# Patient Record
Sex: Female | Born: 1990 | Hispanic: Yes | Marital: Married | Smoking: Never smoker
Health system: Southern US, Community
[De-identification: ages and names within clinical notes are randomized; demographics above are authoritative.]

## PROBLEM LIST (undated history)

## (undated) DIAGNOSIS — K2900 Acute gastritis without bleeding: Secondary | ICD-10-CM

## (undated) DIAGNOSIS — Z8759 Personal history of other complications of pregnancy, childbirth and the puerperium: Secondary | ICD-10-CM

## (undated) DIAGNOSIS — Z227 Latent tuberculosis: Secondary | ICD-10-CM

## (undated) DIAGNOSIS — R222 Localized swelling, mass and lump, trunk: Secondary | ICD-10-CM

## (undated) DIAGNOSIS — Z789 Other specified health status: Secondary | ICD-10-CM

## (undated) HISTORY — DX: Acute gastritis without bleeding: K29.00

## (undated) HISTORY — DX: Localized swelling, mass and lump, trunk: R22.2

## (undated) HISTORY — DX: Personal history of other complications of pregnancy, childbirth and the puerperium: Z87.59

## (undated) HISTORY — DX: Latent tuberculosis: Z22.7

---
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
## 2008-04-11 ENCOUNTER — Observation Stay: Payer: Self-pay | Admitting: Certified Nurse Midwife

## 2008-04-19 ENCOUNTER — Inpatient Hospital Stay: Payer: Self-pay

## 2009-02-20 ENCOUNTER — Ambulatory Visit: Payer: Self-pay | Admitting: Family Medicine

## 2015-10-04 NOTE — L&D Delivery Note (Signed)
Delivery Note  EGA: 39.1 EDC: 07/02/2016,  LMP: 09/26/2015.  At 9:54 AM a viable female was delivered via VBAC, Spontaneous (Presentation: cephalic; ROA).  APGAR: 7,8 ; weight  6-10, .   Placenta status: spontnaneous, intact .  Cord: 3 vessels,  with the following complications: none .  Cord pH: n/a  Anesthesia:  epidural Episiotomy: None Lacerations:  none Suture Repair: n/a Est. Blood Loss (mL):  <50cc  Mom to postpartum.  Baby to Couplet care / Skin to Skin.  Mom presented in labor and requested TOLAC.  Prior cesarean was for fetal bradycardia.  She was given an epidural at advanced dilation. AROM'd for clear fluid.  2nd stage was ~1hrs after reduction of anterior lip.  During 2nd stage there were periods of bradycardia to 90s and tachycardia to 200, but maternal effort was strong and fetal movement was good so we continued.  Vacuum was considered but never used.  Once baby was delivered she was placed on mom's chest and attended to by pediatric staff.  After cord became pulseless (>60sec)  It was doubly clamped and cut by FOB.  [facility].    A Spansh interpreter was present throughout.   Rayvion Stumph C Cheril Slattery 06/26/2016, 10:02 AM

## 2015-11-26 ENCOUNTER — Other Ambulatory Visit: Payer: Self-pay | Admitting: Physician Assistant

## 2015-11-26 DIAGNOSIS — Z369 Encounter for antenatal screening, unspecified: Secondary | ICD-10-CM

## 2015-11-27 ENCOUNTER — Emergency Department
Admission: EM | Admit: 2015-11-27 | Discharge: 2015-11-27 | Disposition: A | Discharge status: Home / Self Care | Payer: PRIVATE HEALTH INSURANCE | Attending: Emergency Medicine | Admitting: Emergency Medicine

## 2015-11-27 ENCOUNTER — Encounter: Payer: Self-pay | Admitting: Emergency Medicine

## 2015-11-27 DIAGNOSIS — O26899 Other specified pregnancy related conditions, unspecified trimester: Secondary | ICD-10-CM

## 2015-11-27 DIAGNOSIS — O9989 Other specified diseases and conditions complicating pregnancy, childbirth and the puerperium: Secondary | ICD-10-CM | POA: Diagnosis not present

## 2015-11-27 DIAGNOSIS — R509 Fever, unspecified: Secondary | ICD-10-CM | POA: Diagnosis not present

## 2015-11-27 DIAGNOSIS — Z3A08 8 weeks gestation of pregnancy: Secondary | ICD-10-CM | POA: Diagnosis not present

## 2015-11-27 DIAGNOSIS — R109 Unspecified abdominal pain: Secondary | ICD-10-CM | POA: Diagnosis not present

## 2015-11-27 LAB — URINALYSIS COMPLETE WITH MICROSCOPIC (ARMC ONLY)
Bilirubin Urine: NEGATIVE
Glucose, UA: NEGATIVE mg/dL
Hgb urine dipstick: NEGATIVE
Ketones, ur: NEGATIVE mg/dL
Leukocytes, UA: NEGATIVE
Nitrite: NEGATIVE
Protein, ur: NEGATIVE mg/dL
Specific Gravity, Urine: 1.024 (ref 1.005–1.030)
pH: 6 (ref 5.0–8.0)

## 2015-11-27 LAB — CBC
HCT: 37.3 % (ref 35.0–47.0)
Hemoglobin: 12.9 g/dL (ref 12.0–16.0)
MCH: 31.8 pg (ref 26.0–34.0)
MCHC: 34.5 g/dL (ref 32.0–36.0)
MCV: 91.9 fL (ref 80.0–100.0)
Platelets: 256 10*3/uL (ref 150–440)
RBC: 4.06 MIL/uL (ref 3.80–5.20)
RDW: 12.5 % (ref 11.5–14.5)
WBC: 11.7 10*3/uL — ABNORMAL HIGH (ref 3.6–11.0)

## 2015-11-27 LAB — COMPREHENSIVE METABOLIC PANEL
ALT: 10 U/L — ABNORMAL LOW (ref 14–54)
AST: 19 U/L (ref 15–41)
Albumin: 3.8 g/dL (ref 3.5–5.0)
Alkaline Phosphatase: 62 U/L (ref 38–126)
Anion gap: 6 (ref 5–15)
BUN: 10 mg/dL (ref 6–20)
CO2: 23 mmol/L (ref 22–32)
Calcium: 9 mg/dL (ref 8.9–10.3)
Chloride: 103 mmol/L (ref 101–111)
Creatinine, Ser: 0.59 mg/dL (ref 0.44–1.00)
GFR calc Af Amer: >60 mL/min (ref >60)
GFR calc non Af Amer: >60 mL/min (ref >60)
Glucose, Bld: 126 mg/dL — ABNORMAL HIGH (ref 65–99)
Potassium: 3.3 mmol/L — ABNORMAL LOW (ref 3.5–5.1)
Sodium: 132 mmol/L — ABNORMAL LOW (ref 135–145)
Total Bilirubin: 0.4 mg/dL (ref 0.3–1.2)
Total Protein: 7.2 g/dL (ref 6.5–8.1)

## 2015-11-27 LAB — HCG, QUANTITATIVE, PREGNANCY: hCG, Beta Chain, Quant, S: 171040 m[IU]/mL — ABNORMAL HIGH (ref <5)

## 2015-11-27 LAB — LIPASE, BLOOD: Lipase: 38 U/L (ref 11–51)

## 2015-11-27 LAB — OB RESULTS CONSOLE HEPATITIS B SURFACE ANTIGEN: Hepatitis B Surface Ag: NEGATIVE

## 2015-11-27 MED ORDER — DIPHENHYDRAMINE HCL 25 MG PO CAPS: 25.0000 mg | ORAL_CAPSULE | Freq: Three times a day (TID) | ORAL | Status: DC | PRN

## 2015-11-27 MED ORDER — METOCLOPRAMIDE HCL 10 MG PO TABS: 10.0000 mg | ORAL_TABLET | Freq: Three times a day (TID) | ORAL | Status: DC | PRN

## 2015-11-27 NOTE — ED Notes (Signed)
Fetal heart tones accessed by ultrasound. FHR 176 bpm

## 2015-11-27 NOTE — ED Notes (Addendum)
Patient states that she has had abd pain and fever that started this morning. Patient denies any sick contacts. Patient is able to eat and drink. Pain is not worse after eating or drinking. Patient has been nauseated but states that this is not new, patient is currently pregnant.

## 2015-11-27 NOTE — Discharge Instructions (Signed)
Dolor abdominal en adultos °(Abdominal Pain, Adult) °El dolor puede tener muchas causas. Normalmente la causa del dolor abdominal no es una enfermedad y mejorará sin tratamiento. Frecuentemente puede controlarse y tratarse en casa. Su médico le realizará un examen físico y posiblemente solicite análisis de sangre y radiografías para ayudar a determinar la gravedad de su dolor. Sin embargo, en muchos casos, debe transcurrir más tiempo antes de que se pueda encontrar una causa evidente del dolor. Antes de llegar a ese punto, es posible que su médico no sepa si necesita más pruebas o un tratamiento más profundo. °INSTRUCCIONES PARA EL CUIDADO EN EL HOGAR  °Esté atento al dolor para ver si hay cambios. Las siguientes indicaciones ayudarán a aliviar cualquier molestia que pueda sentir: °· Tome solo medicamentos de venta libre o recetados, según las indicaciones del médico. °· No tome laxantes a menos que se lo haya indicado su médico. °· Pruebe con una dieta líquida absoluta (caldo, té o agua) según se lo indique su médico. Introduzca gradualmente una dieta normal, según su tolerancia. °SOLICITE ATENCIÓN MÉDICA SI: °· Tiene dolor abdominal sin explicación. °· Tiene dolor abdominal relacionado con náuseas o diarrea. °· Tiene dolor cuando orina o defeca. °· Experimenta dolor abdominal que lo despierta de noche. °· Tiene dolor abdominal que empeora o mejora cuando come alimentos. °· Tiene dolor abdominal que empeora cuando come alimentos grasosos. °· Tiene fiebre. °SOLICITE ATENCIÓN MÉDICA DE INMEDIATO SI:  °· El dolor no desaparece en un plazo máximo de 2 horas. °· No deja de (vomitar). °· El dolor se siente solo en partes del abdomen, como el lado derecho o la parte inferior izquierda del abdomen. °· Evacúa materia fecal sanguinolenta o negra, de aspecto alquitranado. °ASEGÚRESE DE QUE: °· Comprende estas instrucciones. °· Controlará su afección. °· Recibirá ayuda de inmediato si no mejora o si empeora. °  °Esta  información no tiene como fin reemplazar el consejo del médico. Asegúrese de hacerle al médico cualquier pregunta que tenga. °  °Document Released: 09/19/2005 Document Revised: 10/10/2014 °Elsevier Interactive Patient Education ©2016 Elsevier Inc. ° °

## 2015-11-27 NOTE — ED Notes (Signed)
Patient states that she is two months pregnant and started having lower abdominal pain yesterday.  Patient was seen at health department yesterday morning, but started having symptoms yesterday afternoon.  Patient was prescribed phenergan at the health department, but states the pills are not helping.  LMP:  09/26/2015 EDC:  07/02/2016 P: 3  G:1  A: 1

## 2015-11-27 NOTE — ED Provider Notes (Signed)
[facility] Emergency Department Provider Note  ____________________________________________  Time seen: 6:10 PM  I have reviewed the triage vital signs and the nursing notes.   HISTORY  Chief Complaint Abdominal Pain  Interviewed with Spanish interpreter  HPI Dana Stephens is a 25 y.o. female who complains of suprapubic abdominal pain and subjective fever that started this morning. Yesterday she was in her usual state of health and completely asymptomatic. She has had some morning sickness with nausea over the past several weeks as she is [redacted] weeks pregnant and following up with the health department, but is eating and drinking without difficulty. No aggravating or alleviating factors. No vomiting or diarrhea, no chest pain shortness of breath or other symptoms. No urinary symptoms, no vaginal bleeding or discharge.  G3 P1. History of one abortion. LMP 09/26/2015.     History reviewed. No pertinent past medical history.   There are no active problems to display for this patient.    History reviewed. No pertinent past surgical history.   Current Outpatient Rx  Name  Route  Sig  Dispense  Refill  . diphenhydrAMINE (BENADRYL) 25 mg capsule   Oral   Take 1 capsule (25 mg total) by mouth every 8 (eight) hours as needed (nausea).   20 capsule   0   . metoCLOPramide (REGLAN) 10 MG tablet   Oral   Take 1 tablet (10 mg total) by mouth every 8 (eight) hours as needed for nausea.   60 tablet   0      Allergies Review of patient's allergies indicates no known allergies.   No family history on file.  Social History Social History  Substance Use Topics  . Smoking status: Never Smoker   . Smokeless tobacco: Never Used  . Alcohol Use: No    Review of Systems  Constitutional:   Subjective fever. No weight changes Eyes:   No blurry vision or double vision.  ENT:   No sore throat.  Cardiovascular:   No chest pain. Respiratory:   No  dyspnea or cough. Gastrointestinal:   Positive for abdominal pain, without vomiting and diarrhea.  No BRBPR or melena. Genitourinary:   Negative for dysuria or difficulty urinating. Musculoskeletal:   Negative for back pain. No joint swelling or pain. Skin:   Negative for rash. Neurological:   Negative for headaches, focal weakness or numbness. Psychiatric:  No anxiety or depression.   Endocrine:  No changes in energy or sleep difficulty.  10-point ROS otherwise negative.  ____________________________________________   PHYSICAL EXAM:  VITAL SIGNS: ED Triage Vitals  Enc Vitals Group     BP 11/27/15 1622 113/62 mmHg     Pulse Rate 11/27/15 1622 92     Resp 11/27/15 1622 16     Temp 11/27/15 1622 98.8 F (37.1 C)     Temp Source 11/27/15 1622 Oral     SpO2 11/27/15 1622 100 %     Weight 11/27/15 1622 118 lb (53.524 kg)     Height 11/27/15 1622  (1.702 m)     Head Cir --      Peak Flow --      Pain Score 11/27/15 1623 8     Pain Loc --      Pain Edu? --      Excl. in GC? --     Vital signs reviewed, nursing assessments reviewed.   Constitutional:   Alert and oriented. Well appearing and in no distress. Eyes:   No  scleral icterus. No conjunctival pallor. PERRL. EOMI ENT   Head:   Normocephalic and atraumatic.   Nose:   No congestion/rhinnorhea. No septal hematoma   Mouth/Throat:   MMM, no pharyngeal erythema. No peritonsillar mass.    Neck:   No stridor. No SubQ emphysema. No meningismus. Hematological/Lymphatic/Immunilogical:   No cervical lymphadenopathy. Cardiovascular:   RRR. Symmetric bilateral radial and DP pulses.  No murmurs.  Respiratory:   Normal respiratory effort without tachypnea nor retractions. Breath sounds are clear and equal bilaterally. No wheezes/rales/rhonchi. Gastrointestinal:   Soft and nontender. Non distended. There is no CVA tenderness.  No rebound, rigidity, or guarding. Genitourinary:   deferred Musculoskeletal:   Nontender  with normal range of motion in all extremities. No joint effusions.  No lower extremity tenderness.  No edema. Neurologic:   Normal speech and language.  CN 2-10 normal. Motor grossly intact. No gross focal neurologic deficits are appreciated.  Skin:    Skin is warm, dry and intact. No rash noted.  No petechiae, purpura, or bullae. Psychiatric:   Mood and affect are normal. ____________________________________________    LABS (pertinent positives/negatives) (all labs ordered are listed, but only abnormal results are displayed) Labs Reviewed  COMPREHENSIVE METABOLIC PANEL - Abnormal; Notable for the following:    Sodium 132 (*)    Potassium 3.3 (*)    Glucose, Bld 126 (*)    ALT 10 (*)    All other components within normal limits  CBC - Abnormal; Notable for the following:    WBC 11.7 (*)    All other components within normal limits  URINALYSIS COMPLETEWITH MICROSCOPIC ([facility] ONLY) - Abnormal; Notable for the following:    Color, Urine YELLOW (*)    APPearance HAZY (*)    Bacteria, UA RARE (*)    Squamous Epithelial / LPF 6-30 (*)    All other components within normal limits  HCG, QUANTITATIVE, PREGNANCY - Abnormal; Notable for the following:    hCG, Beta Chain, Quant, S 171040 (*)    All other components within normal limits  LIPASE, BLOOD   ____________________________________________   EKG    ____________________________________________    RADIOLOGY    ____________________________________________   PROCEDURES   ____________________________________________   INITIAL IMPRESSION / ASSESSMENT AND PLAN / ED COURSE  Pertinent labs & imaging results that were available during my care of the patient were reviewed by me and considered in my medical decision making (see chart for details).  Patient well appearing no acute distress. Exam is unremarkable and reassuring. Labs are also completely unremarkable. Low suspicion for appendicitis urinary tract infection or  pyelonephritis or bacterial vaginosis or STI. We'll discharge home to follow up with health department. I suspect this is typical pregnancy pain related to expanding tissues. She reports inadequate relief of her nausea with Phenergan so prescribe her Reglan and Benadryl as needed. These are both category B for pregnancy     ____________________________________________   FINAL CLINICAL IMPRESSION(S) / ED DIAGNOSES  Final diagnoses:  Abdominal pain in pregnancy      Sharman Cheek, MD 11/27/15 1836

## 2015-12-21 ENCOUNTER — Ambulatory Visit
Admission: RE | Admit: 2015-12-21 | Discharge: 2015-12-21 | Disposition: A | Discharge status: Home / Self Care | Payer: PRIVATE HEALTH INSURANCE | Source: Ambulatory Visit | Attending: Physician Assistant | Admitting: Physician Assistant

## 2015-12-21 ENCOUNTER — Ambulatory Visit (HOSPITAL_BASED_OUTPATIENT_CLINIC_OR_DEPARTMENT_OTHER)
Admission: RE | Admit: 2015-12-21 | Discharge: 2015-12-21 | Disposition: A | Discharge status: Home / Self Care | Payer: PRIVATE HEALTH INSURANCE | Source: Ambulatory Visit | Attending: Obstetrics and Gynecology | Admitting: Obstetrics and Gynecology

## 2015-12-21 VITALS — BP 116/63 | HR 96 | Temp 98.3°F | Resp 17 | Ht 61.0 in | Wt 119.0 lb

## 2015-12-21 DIAGNOSIS — Z363 Encounter for antenatal screening for malformations: Secondary | ICD-10-CM

## 2015-12-21 DIAGNOSIS — Z369 Encounter for antenatal screening, unspecified: Secondary | ICD-10-CM

## 2015-12-21 DIAGNOSIS — Z36 Encounter for antenatal screening of mother: Secondary | ICD-10-CM | POA: Diagnosis not present
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
## 2015-12-21 NOTE — Progress Notes (Signed)
Dana Wells, MS, CGC performed an integral service incident to the physician's initial service.  I was physically present in the clinical area and was immediately available to render assistance.   [name]  

## 2015-12-24 ENCOUNTER — Telehealth: Payer: Self-pay | Admitting: Obstetrics and Gynecology

## 2015-12-24 NOTE — Telephone Encounter (Signed)
  [name] elected to undergo First Trimester screening on 12/21/2015.  To review, first trimester screening, includes nuchal translucency ultrasound screen and/or first trimester maternal serum marker screening.  The nuchal translucency has approximately an 80% detection rate for Down syndrome and can be positive for other chromosome abnormalities as well as heart defects.  When combined with a maternal serum marker screening, the detection rate is up to 90% for Down syndrome and up to 97% for trisomy 13 and 18.     The results of the First Trimester Nuchal Translucency and Biochemical Screening were within normal range.  The risk for Down syndrome is now estimated to be less than 1 in 10,000.  The risk for Trisomy 13/18 is also estimated to be less than 1 in 10,000.  Should more definitive information be desired, we would offer amniocentesis.  Because we do not yet know the effectiveness of combined first and second trimester screening, we do not recommend a maternal serum screen to assess the chance for chromosome conditions.  However, if screening for neural tube defects is desired, maternal serum screening for AFP only can be performed between 15 and [redacted] weeks gestation.    Cherly Andersoneborah F. Hesham Womac, MS, CGC

## 2016-02-01 ENCOUNTER — Ambulatory Visit
Admission: RE | Admit: 2016-02-01 | Discharge: 2016-02-01 | Disposition: A | Discharge status: Home / Self Care | Payer: PRIVATE HEALTH INSURANCE | Source: Ambulatory Visit | Attending: Obstetrics & Gynecology | Admitting: Obstetrics & Gynecology

## 2016-02-01 DIAGNOSIS — Z36 Encounter for antenatal screening of mother: Secondary | ICD-10-CM | POA: Insufficient documentation

## 2016-02-01 DIAGNOSIS — Z363 Encounter for antenatal screening for malformations: Secondary | ICD-10-CM

## 2016-02-01 DIAGNOSIS — Z3A18 18 weeks gestation of pregnancy: Secondary | ICD-10-CM | POA: Diagnosis not present

## 2016-04-07 LAB — OB RESULTS CONSOLE HIV ANTIBODY (ROUTINE TESTING): HIV: NONREACTIVE

## 2016-04-08 LAB — OB RESULTS CONSOLE GC/CHLAMYDIA
Chlamydia: NEGATIVE
Gonorrhea: NEGATIVE

## 2016-04-08 LAB — OB RESULTS CONSOLE RPR: RPR: NONREACTIVE

## 2016-06-13 LAB — OB RESULTS CONSOLE GBS: GBS: NEGATIVE

## 2016-06-26 ENCOUNTER — Inpatient Hospital Stay
Admission: EM | Admit: 2016-06-26 | Discharge: 2016-06-27 | DRG: 775 | Disposition: A | Discharge status: Home / Self Care | Payer: Medicaid Other | Attending: Obstetrics and Gynecology | Admitting: Obstetrics and Gynecology

## 2016-06-26 ENCOUNTER — Inpatient Hospital Stay: Payer: Medicaid Other | Admitting: Anesthesiology

## 2016-06-26 DIAGNOSIS — Z7982 Long term (current) use of aspirin: Secondary | ICD-10-CM

## 2016-06-26 DIAGNOSIS — Z98891 History of uterine scar from previous surgery: Secondary | ICD-10-CM | POA: Diagnosis present

## 2016-06-26 DIAGNOSIS — R001 Bradycardia, unspecified: Secondary | ICD-10-CM | POA: Diagnosis present

## 2016-06-26 DIAGNOSIS — Z79899 Other long term (current) drug therapy: Secondary | ICD-10-CM

## 2016-06-26 DIAGNOSIS — Z3A39 39 weeks gestation of pregnancy: Secondary | ICD-10-CM | POA: Diagnosis not present

## 2016-06-26 DIAGNOSIS — O34211 Maternal care for low transverse scar from previous cesarean delivery: Secondary | ICD-10-CM | POA: Diagnosis present

## 2016-06-26 DIAGNOSIS — Z8759 Personal history of other complications of pregnancy, childbirth and the puerperium: Secondary | ICD-10-CM

## 2016-06-26 DIAGNOSIS — O0993 Supervision of high risk pregnancy, unspecified, third trimester: Secondary | ICD-10-CM

## 2016-06-26 DIAGNOSIS — O34219 Maternal care for unspecified type scar from previous cesarean delivery: Secondary | ICD-10-CM | POA: Diagnosis present

## 2016-06-26 DIAGNOSIS — Z227 Latent tuberculosis: Secondary | ICD-10-CM | POA: Diagnosis present

## 2016-06-26 DIAGNOSIS — R000 Tachycardia, unspecified: Secondary | ICD-10-CM | POA: Diagnosis present

## 2016-06-26 DIAGNOSIS — Z369 Encounter for antenatal screening, unspecified: Secondary | ICD-10-CM

## 2016-06-26 DIAGNOSIS — O09299 Supervision of pregnancy with other poor reproductive or obstetric history, unspecified trimester: Secondary | ICD-10-CM

## 2016-06-26 HISTORY — DX: Other specified health status: Z78.9

## 2016-06-26 LAB — COMPREHENSIVE METABOLIC PANEL
ALT: 18 U/L (ref 14–54)
AST: 32 U/L (ref 15–41)
Albumin: 3.4 g/dL — ABNORMAL LOW (ref 3.5–5.0)
Alkaline Phosphatase: 155 U/L — ABNORMAL HIGH (ref 38–126)
Anion gap: 5 (ref 5–15)
BUN: 15 mg/dL (ref 6–20)
CO2: 23 mmol/L (ref 22–32)
Calcium: 10.3 mg/dL (ref 8.9–10.3)
Chloride: 107 mmol/L (ref 101–111)
Creatinine, Ser: 0.66 mg/dL (ref 0.44–1.00)
GFR calc Af Amer: >60 mL/min (ref >60)
GFR calc non Af Amer: >60 mL/min (ref >60)
Glucose, Bld: 85 mg/dL (ref 65–99)
Potassium: 4 mmol/L (ref 3.5–5.1)
Sodium: 135 mmol/L (ref 135–145)
Total Bilirubin: 0.4 mg/dL (ref 0.3–1.2)
Total Protein: 7.1 g/dL (ref 6.5–8.1)

## 2016-06-26 LAB — CBC
HCT: 39.8 % (ref 35.0–47.0)
Hemoglobin: 13.7 g/dL (ref 12.0–16.0)
MCH: 31.4 pg (ref 26.0–34.0)
MCHC: 34.5 g/dL (ref 32.0–36.0)
MCV: 91 fL (ref 80.0–100.0)
Platelets: 191 10*3/uL (ref 150–440)
RBC: 4.37 MIL/uL (ref 3.80–5.20)
RDW: 13.6 % (ref 11.5–14.5)
WBC: 14.2 10*3/uL — ABNORMAL HIGH (ref 3.6–11.0)

## 2016-06-26 LAB — TYPE AND SCREEN
ABO/RH(D): O POS
Antibody Screen: NEGATIVE

## 2016-06-26 LAB — PROTEIN / CREATININE RATIO, URINE
Creatinine, Urine: 130 mg/dL
Protein Creatinine Ratio: 0.27 mg/mg{Cre} — ABNORMAL HIGH (ref 0.00–0.15)
Total Protein, Urine: 35 mg/dL

## 2016-06-26 LAB — URIC ACID: Uric Acid, Serum: 6.5 mg/dL (ref 2.3–6.6)

## 2016-06-26 MED ORDER — FENTANYL 2.5 MCG/ML W/ROPIVACAINE 0.2% IN NS 100 ML EPIDURAL INFUSION (ARMC-ANES): 10.0000 mL/h | EPIDURAL | Status: DC

## 2016-06-26 MED ORDER — AMMONIA AROMATIC IN INHA
RESPIRATORY_TRACT | Status: AC
  Filled 2016-06-26: qty 10

## 2016-06-26 MED ORDER — INFLUENZA VAC SPLIT QUAD 0.5 ML IM SUSY
0.5000 mL | PREFILLED_SYRINGE | INTRAMUSCULAR | Status: AC | PRN
  Administered 2016-06-27: 0.5 mL via INTRAMUSCULAR
  Filled 2016-06-26: qty 0.5

## 2016-06-26 MED ORDER — NALOXONE HCL 0.4 MG/ML IJ SOLN: 0.4000 mg | INTRAMUSCULAR | Status: DC | PRN

## 2016-06-26 MED ORDER — NALOXONE HCL 2 MG/2ML IJ SOSY: 1.0000 ug/kg/h | PREFILLED_SYRINGE | INTRAVENOUS | Status: DC | PRN

## 2016-06-26 MED ORDER — WITCH HAZEL-GLYCERIN EX PADS: 1.0000 "application " | MEDICATED_PAD | CUTANEOUS | Status: DC | PRN

## 2016-06-26 MED ORDER — PRENATAL MULTIVITAMIN CH
1.0000 | ORAL_TABLET | Freq: Every day | ORAL | Status: DC
  Administered 2016-06-26 – 2016-06-27 (×2): 1 via ORAL
  Filled 2016-06-26 (×2): qty 1

## 2016-06-26 MED ORDER — LIDOCAINE-EPINEPHRINE (PF) 1.5 %-1:200000 IJ SOLN
INTRAMUSCULAR | Status: DC | PRN
  Administered 2016-06-26: 3 mL via EPIDURAL

## 2016-06-26 MED ORDER — FENTANYL CITRATE (PF) 100 MCG/2ML IJ SOLN: 100.0000 ug | Freq: Once | INTRAMUSCULAR | Status: DC

## 2016-06-26 MED ORDER — MISOPROSTOL 200 MCG PO TABS
ORAL_TABLET | ORAL | Status: DC
Start: 2016-06-26 — End: 2016-06-26
  Filled 2016-06-26: qty 4

## 2016-06-26 MED ORDER — DIPHENHYDRAMINE HCL 50 MG/ML IJ SOLN: 12.5000 mg | INTRAMUSCULAR | Status: DC | PRN

## 2016-06-26 MED ORDER — COCONUT OIL OIL: 1.0000 "application " | TOPICAL_OIL | Status: DC | PRN

## 2016-06-26 MED ORDER — MEPERIDINE HCL 25 MG/ML IJ SOLN: 6.2500 mg | INTRAMUSCULAR | Status: DC | PRN

## 2016-06-26 MED ORDER — ONDANSETRON HCL 4 MG/2ML IJ SOLN: 4.0000 mg | INTRAMUSCULAR | Status: DC | PRN

## 2016-06-26 MED ORDER — LIDOCAINE HCL (PF) 1 % IJ SOLN
INTRAMUSCULAR | Status: AC
  Filled 2016-06-26: qty 30

## 2016-06-26 MED ORDER — OXYTOCIN 10 UNIT/ML IJ SOLN
INTRAMUSCULAR | Status: DC
Start: 2016-06-26 — End: 2016-06-26
  Filled 2016-06-26: qty 2

## 2016-06-26 MED ORDER — ACETAMINOPHEN 500 MG PO TABS: 1000.0000 mg | ORAL_TABLET | Freq: Four times a day (QID) | ORAL | Status: DC | PRN

## 2016-06-26 MED ORDER — ONDANSETRON HCL 4 MG/2ML IJ SOLN: 4.0000 mg | Freq: Four times a day (QID) | INTRAMUSCULAR | Status: DC | PRN

## 2016-06-26 MED ORDER — LIDOCAINE HCL (PF) 1 % IJ SOLN
INTRAMUSCULAR | Status: DC | PRN
  Administered 2016-06-26: 2 mL via SUBCUTANEOUS

## 2016-06-26 MED ORDER — NALBUPHINE HCL 10 MG/ML IJ SOLN: 5.0000 mg | INTRAMUSCULAR | Status: DC | PRN

## 2016-06-26 MED ORDER — LIDOCAINE HCL (PF) 1 % IJ SOLN: 30.0000 mL | INTRAMUSCULAR | Status: DC | PRN

## 2016-06-26 MED ORDER — FENTANYL 2.5 MCG/ML W/ROPIVACAINE 0.2% IN NS 100 ML EPIDURAL INFUSION (ARMC-ANES)
EPIDURAL | Status: AC
  Filled 2016-06-26: qty 100

## 2016-06-26 MED ORDER — OXYTOCIN BOLUS FROM INFUSION
500.0000 mL | Freq: Once | INTRAVENOUS | Status: AC
  Administered 2016-06-26: 500 mL via INTRAVENOUS

## 2016-06-26 MED ORDER — SIMETHICONE 80 MG PO CHEW: 80.0000 mg | CHEWABLE_TABLET | ORAL | Status: DC | PRN

## 2016-06-26 MED ORDER — NALBUPHINE HCL 10 MG/ML IJ SOLN: 5.0000 mg | Freq: Once | INTRAMUSCULAR | Status: DC | PRN

## 2016-06-26 MED ORDER — SODIUM CHLORIDE 0.9% FLUSH: 3.0000 mL | INTRAVENOUS | Status: DC | PRN

## 2016-06-26 MED ORDER — LACTATED RINGERS IV SOLN: 500.0000 mL | INTRAVENOUS | Status: DC | PRN

## 2016-06-26 MED ORDER — ONDANSETRON HCL 4 MG PO TABS: 4.0000 mg | ORAL_TABLET | ORAL | Status: DC | PRN

## 2016-06-26 MED ORDER — OXYTOCIN 40 UNITS IN LACTATED RINGERS INFUSION - SIMPLE MED
2.5000 [IU]/h | INTRAVENOUS | Status: DC
  Administered 2016-06-26: 2.5 [IU]/h via INTRAVENOUS

## 2016-06-26 MED ORDER — IBUPROFEN 600 MG PO TABS
600.0000 mg | ORAL_TABLET | Freq: Four times a day (QID) | ORAL | Status: DC
  Administered 2016-06-26 – 2016-06-27 (×7): 600 mg via ORAL
  Filled 2016-06-26 (×6): qty 1

## 2016-06-26 MED ORDER — MEDROXYPROGESTERONE ACETATE 150 MG/ML IM SUSP: 150.0000 mg | INTRAMUSCULAR | Status: DC | PRN

## 2016-06-26 MED ORDER — IBUPROFEN 600 MG PO TABS
ORAL_TABLET | ORAL | Status: AC
  Administered 2016-06-26: 600 mg via ORAL
  Filled 2016-06-26: qty 1

## 2016-06-26 MED ORDER — DIPHENHYDRAMINE HCL 25 MG PO CAPS: 25.0000 mg | ORAL_CAPSULE | ORAL | Status: DC | PRN

## 2016-06-26 MED ORDER — FLEET ENEMA 7-19 GM/118ML RE ENEM: 1.0000 | ENEMA | RECTAL | Status: DC | PRN

## 2016-06-26 MED ORDER — SOD CITRATE-CITRIC ACID 500-334 MG/5ML PO SOLN
30.0000 mL | ORAL | Status: DC | PRN
Start: 2016-06-26 — End: 2016-06-28
  Filled 2016-06-26: qty 30

## 2016-06-26 MED ORDER — KETOROLAC TROMETHAMINE 30 MG/ML IJ SOLN: 30.0000 mg | Freq: Four times a day (QID) | INTRAMUSCULAR | Status: AC | PRN

## 2016-06-26 MED ORDER — FENTANYL CITRATE (PF) 100 MCG/2ML IJ SOLN
INTRAMUSCULAR | Status: AC
  Administered 2016-06-26: 50 ug via INTRAVENOUS
  Filled 2016-06-26: qty 2

## 2016-06-26 MED ORDER — FENTANYL 2.5 MCG/ML W/ROPIVACAINE 0.2% IN NS 100 ML EPIDURAL INFUSION (ARMC-ANES)
EPIDURAL | Status: DC | PRN
  Administered 2016-06-26: 10 mL/h via EPIDURAL

## 2016-06-26 MED ORDER — ONDANSETRON HCL 4 MG/2ML IJ SOLN: 4.0000 mg | Freq: Three times a day (TID) | INTRAMUSCULAR | Status: DC | PRN

## 2016-06-26 MED ORDER — FENTANYL CITRATE (PF) 100 MCG/2ML IJ SOLN
50.0000 ug | Freq: Once | INTRAMUSCULAR | Status: AC
  Administered 2016-06-26: 50 ug via INTRAVENOUS

## 2016-06-26 MED ORDER — DIBUCAINE 1 % RE OINT: 1.0000 "application " | TOPICAL_OINTMENT | RECTAL | Status: DC | PRN

## 2016-06-26 MED ORDER — ACETAMINOPHEN 325 MG PO TABS: 650.0000 mg | ORAL_TABLET | ORAL | Status: DC | PRN

## 2016-06-26 MED ORDER — LACTATED RINGERS IV SOLN
INTRAVENOUS | Status: DC
  Administered 2016-06-26 (×2): via INTRAVENOUS

## 2016-06-26 MED ORDER — SODIUM CHLORIDE 0.9 % IV SOLN
INTRAVENOUS | Status: DC | PRN
  Administered 2016-06-26 (×2): 4 mL via EPIDURAL

## 2016-06-26 MED ORDER — OXYTOCIN 40 UNITS IN LACTATED RINGERS INFUSION - SIMPLE MED
INTRAVENOUS | Status: AC
  Administered 2016-06-26: 500 mL via INTRAVENOUS
  Filled 2016-06-26: qty 1000

## 2016-06-26 MED ORDER — DOCUSATE SODIUM 100 MG PO CAPS
100.0000 mg | ORAL_CAPSULE | Freq: Two times a day (BID) | ORAL | Status: DC
  Administered 2016-06-26 – 2016-06-27 (×3): 100 mg via ORAL
  Filled 2016-06-26 (×3): qty 1

## 2016-06-26 NOTE — OB Triage Note (Signed)
Patient came in today c/o contractions that began at 1900. Contractions felt mild per patient and since then have increase. At 0000 patient stated that contractions were very strong and decided to come in the ED. Reports positive fetal movement. Denies leakage and vaginal bleeding.

## 2016-06-26 NOTE — Anesthesia Preprocedure Evaluation (Signed)
Anesthesia Evaluation  Patient identified by MRN, date of birth, ID band Patient awake    Reviewed: Allergy & Precautions, H&P , NPO status , Patient's Chart, lab work & pertinent test results, reviewed documented beta blocker date and time   History of Anesthesia Complications Negative for: history of anesthetic complications  Airway Mallampati: III  TM Distance: >3 FB Neck ROM: full    Dental no notable dental hx. (+) Teeth Intact, Caps   Pulmonary neg pulmonary ROS,           Cardiovascular Exercise Tolerance: Good negative cardio ROS       Neuro/Psych negative neurological ROS  negative psych ROS   GI/Hepatic negative GI ROS, Neg liver ROS,   Endo/Other  negative endocrine ROS  Renal/GU negative Renal ROS  negative genitourinary   Musculoskeletal   Abdominal   Peds  Hematology negative hematology ROS (+)   Anesthesia Other Findings Past Medical History: No date: Medical history non-contributory   Reproductive/Obstetrics negative OB ROS                             Anesthesia Physical Anesthesia Plan  ASA: II  Anesthesia Plan: Epidural   Post-op Pain Management:    Induction:   Airway Management Planned:   Additional Equipment:   Intra-op Plan:   Post-operative Plan:   Informed Consent: I have reviewed the patients History and Physical, chart, labs and discussed the procedure including the risks, benefits and alternatives for the proposed anesthesia with the patient or authorized representative who has indicated his/her understanding and acceptance.   Dental Advisory Given  Plan Discussed with: Anesthesiologist, CRNA and Surgeon  Anesthesia Plan Comments:         Anesthesia Quick Evaluation

## 2016-06-26 NOTE — Discharge Summary (Addendum)
Obstetrical Discharge Summary  Patient Name: Dana Stephens DOB: 10/31/1990 MRN: [id number]  Date of Admission: 06/26/2016 Date of Discharge: 06/27/16 Primary OB: [facility]  Gestational Age at Delivery: 4424w1d   Antepartum complications: history of cesarean, currently pregnant, latent TB,  Admitting Diagnosis: labor, trial of labor after cesarean Secondary Diagnosis: Patient Active Problem List   Diagnosis Date Noted  . Labor and delivery, indication for care 06/26/2016  . History of cesarean delivery, currently pregnant 06/26/2016  . 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 Supervision of high risk pregnancy in third trimester 06/26/2016  . History of pre-eclampsia in prior pregnancy, currently pregnant 06/26/2016    Augmentation: AROM Complications: None Intrapartum complications/course: Mom presented in labor and requested TOLAC.  Prior cesarean was for fetal bradycardia.  She was given an epidural at advanced dilation. AROM'd for clear fluid.  2nd stage was ~1hrs after reduction of anterior lip.  During 2nd stage there were periods of bradycardia to 90s and tachycardia to 200, but maternal effort was strong and fetal movement was good so we continued.  Vacuum was considered but never used.  Once baby was delivered she was placed on mom's chest and attended to by pediatric staff.  After cord became pulseless (>60sec)  It was doubly clamped and cut by FOB.  We sang happy birthday to [facility] Date of Delivery:  06/26/16 Delivered By: Leeroy Bockhelsea Ward Delivery Type: vaginal birth after cesarean (VBAC) Anesthesia: epidural Placenta: sponatneous, sent to pathology due to latent TB status Laceration: none Episiotomy: none Newborn Data: Live born unspecified sex  Birth Weight: 6 lb 10 oz (3005 g) APGAR: 7, 8    Discharge Physical Exam:  BP 138/85   Pulse 87   Temp 99.1 F (37.3 C)   Resp 18   Ht 5\' 1"  (1.549 m)   Wt 64 kg (141 lb)   LMP 09/26/2015   SpO2 96%   BMI 26.64 kg/m    General: NAD CV: RRR Pulm: CTABL, nl effort ABD: s/nd/nt, fundus firm and below the umbilicus Lochia: moderate DVT Evaluation: LE non-ttp, no evidence of DVT on exam.  Hemoglobin  Date Value Ref Range Status  06/26/2016 13.7 12.0 - 16.0 g/dL Final   HCT  Date Value Ref Range Status  06/26/2016 39.8 35.0 - 47.0 % Final  POD#1 hct 34.5   Post partum course: uncomplicated  Postpartum Procedures: none  Disposition: stable, discharge to home. Baby Feeding: formula Baby Disposition: home with mom  Rh Immune globulin given: no Rubella vaccine given: no Tdap vaccine given in AP or PP setting: antepartum Flu vaccine given in AP or PP setting: postpartum  Contraception: depo-provera  Prenatal Labs:  ABO, Rh:  O Positive Antibody:  Negative Rubella:   Immune Varicella: Immune RPR:   NR HBsAg:   Negative HIV:   NR GTT: 137, 3 hr WNL GBS:   Negative    Plan:  Dana Stephens was discharged to home in good condition. Follow-up appointment at [facility] in 6 weeks for routine post partum visit.   Discharge Medications: Motrin  Depo provera prior to d/c   Follow-up Information    [facility] Follow up in 6 week(s).   Why:  4-6 weeks for post partum visit Contact information: [address] [place] [id number] [phone]           Signed: Ihor Austinhomas J Laurette Villescas MD

## 2016-06-26 NOTE — Progress Notes (Signed)
S:  Having some early decels with ctxs       Feeling some slight pressure      Last check at 5am: 9cm/90/0-+1 O:  VS: Blood pressure 105/60, pulse (!) 102, temperature 98.8 F (37.1 C), temperature source Oral, resp. rate 18, height 5\' 1"  (1.549 m), weight 64 kg (141 lb), last menstrual period 09/26/2015, SpO2 97 %.        FHR : baseline 140 bpm/ variability moderate / accelerations + / early/variable decelerations        Toco: contractions every 1-3 minutes / moderate         Cervix : Dilation: Lip/rim Effacement (%): 90 Station: +1 Presentation: Vertex Exam by:: M. Deona Novitski CNM  Cervical swelling noted on right side        Membranes: AROM - bloody show  A: Active labor     FHR category 2     VBAC protocol in place  P: Peanut ball  Attempted to reduce cervix without success   Anticipate successful VBAC  Carlean JewsMeredith Kataya Guimont, CNM

## 2016-06-26 NOTE — Progress Notes (Addendum)
S:  Pt. Is now comfortable with epidural        Discussed AROM with patient and partner and they agree  O:  VS: Blood pressure 126/84, pulse (!) 113, temperature 98.3 F (36.8 C), temperature source Oral, resp. rate 18, height 5\' 1"  (1.549 m), weight 64 kg (141 lb), last menstrual period 09/26/2015, SpO2 100 %.        FHR : baseline 135 bpm/ variability moderate / accelerations + / one late and one variable decelerations noted prior to AROM        Toco: contractions every 1-2 minutes / moderate-strong        Cervix : Dilation: 8 Effacement (%): 90 Station: -1 Presentation: Vertex Exam by:: M. Boden Stucky CNM        Membranes: AROM - clear fluid   A: Active labor     FHR category 2     TOLAC  P: VBAC protocol in place      Anticipate VBAC   Dana JewsMeredith Antionne Stephens, CNM

## 2016-06-26 NOTE — Discharge Instructions (Signed)

## 2016-06-26 NOTE — H&P (Signed)
OB ADMISSION/ HISTORY & PHYSICAL:  Admission Date: 06/26/2016 12:27 AM  Admit Diagnosis: Onset of labor at 39+1 weeks   Dana Stephens is a 25 y.o. female G3P1 presenting for onset of labor at 39+1 weeks.  She is a previous LTCS in 2009 for pre-eclampsia and fetal bradycardia.  She desires a TOLAC, and had a delivery planning consult with Dr. Dalbert Garnet at [facility] on 06/07/16.  She is spanish speaking only.   Prenatal History: G3P1011   EDC : 07/02/2016, LMP 09/26/15 Prenatal care at [facility] Prenatal course complicated by hx. Of pre-eclampsia in 2009 with intrapartum Magnesium Sulfate, primary LTCS at [facility] for fetal bradycardia, Latent TB - s/p INH treatment in 2011.   Prenatal Labs: ABO, Rh:  O Positive Antibody:  Negative Rubella:   Immune Varicella: Immune RPR:   NR HBsAg:   Negative HIV:   NR GTT: 137, 3 hr WNL GBS:   Negative   Medical / Surgical History :  Past medical history:  Past Medical History:  Diagnosis Date  . Medical history non-contributory      Past surgical history:  Past Surgical History:  Procedure Laterality Date  . CESAREAN SECTION  2009    Family History: History reviewed. No pertinent family history.   Social History:  reports that she has never smoked. She has never used smokeless tobacco. She reports that she does not drink alcohol or use drugs.   Allergies: Review of patient's allergies indicates no known allergies.    Current Medications at time of admission:  Prior to Admission medications   Medication Sig Start Date End Date Taking? Authorizing Provider  aspirin 81 MG tablet Take 81 mg by mouth daily.   Yes Historical Provider, MD  Prenatal Vit-Fe Fumarate-FA (PRENATAL MULTIVITAMIN) TABS tablet Take 1 tablet by mouth daily at 12 noon.   Yes Historical Provider, MD  diphenhydrAMINE (BENADRYL) 25 mg capsule Take 1 capsule (25 mg total) by mouth every 8 (eight) hours as needed (nausea). Patient not taking: Reported on 12/21/2015 11/27/15    Sharman Cheek, MD  metoCLOPramide (REGLAN) 10 MG tablet Take 1 tablet (10 mg total) by mouth every 8 (eight) hours as needed for nausea. Patient not taking: Reported on 06/26/2016 11/27/15   Sharman Cheek, MD     Review of Systems: Active FM onset of ctx @ 1900 currently every 1-2 minutes No LOF  / SROM  No bloody show    Physical Exam:  VS: Blood pressure (!) 146/88, pulse (!) 104, temperature 98.3 F (36.8 C), temperature source Oral, resp. rate 20, height 5\' 1"  (1.549 m), weight 64 kg (141 lb), last menstrual period 09/26/2015.  General: alert and oriented, appears calm Heart: RRR Lungs: Clear lung fields Abdomen: Gravid, soft and non-tender, non-distended / uterus: gravid, non-tender between ctxs Extremities: no edema  Genitalia / VE: Dilation: 4.5 Effacement (%): 90 Station: -1 Exam by:: E. Leticia Clas, RN  FHR: baseline rate 135 bpm / variability moderate / accelerations + / no decelerations TOCO: 1-3 minutes   Assessment: 39+[redacted] weeks gestation Latent stage of labor FHR category 1 TOLAC - hx. Of LTCS for fetal bradycardia    Plan:  1. Admit to Birth Place for TOLAC      - VBAC protocol initiated and team notified     - Routine labor and delivery orders     - May have epidural upon request      - Elevated BP on admission - baseline PIH labs on admission  2. GBS Negative     -  No prophylaxis indicated 3. Contraception:      - Depo injection 4. Formula feeding 5. Anticipate successful VBAC  Dr. Elesa MassedWard notified of admission / plan of care  Carlean JewsMeredith Dessire Grimes, CNM

## 2016-06-26 NOTE — Anesthesia Procedure Notes (Signed)
Epidural Patient location during procedure: OB Start time: 06/26/2016 3:05 AM End time: 06/26/2016 3:12 AM  Staffing Anesthesiologist: Lenard SimmerKARENZ, Kathyleen Radice Performed: anesthesiologist   Preanesthetic Checklist Completed: patient identified, site marked, surgical consent, pre-op evaluation, timeout performed, IV checked, risks and benefits discussed and monitors and equipment checked  Epidural Patient position: sitting Prep: ChloraPrep Patient monitoring: heart rate, continuous pulse ox and blood pressure Approach: midline Location: L3-L4 Injection technique: LOR saline  Needle:  Needle type: Tuohy  Needle gauge: 18 G Needle length: 9 cm and 9 Needle insertion depth: 4 cm Catheter type: closed end flexible Catheter size: 20 Guage Catheter at skin depth: 9 cm Test dose: negative and 1.5% lidocaine with Epi 1:200 K  Assessment Sensory level: T10 Events: blood not aspirated, injection not painful, no injection resistance, negative IV test and no paresthesia  Additional Notes Pt. Evaluated and documentation done after procedure finished. Patient identified. Risks/Benefits/Options discussed with patient including but not limited to bleeding, infection, nerve damage, paralysis, failed block, incomplete pain control, headache, blood pressure changes, nausea, vomiting, reactions to medication both or allergic, itching and postpartum back pain. Confirmed with bedside nurse the patient's most recent platelet count. Confirmed with patient that they are not currently taking any anticoagulation, have any bleeding history or any family history of bleeding disorders. Patient expressed understanding and wished to proceed. All questions were answered. Sterile technique was used throughout the entire procedure. Please see nursing notes for vital signs. Test dose was given through epidural catheter and negative prior to continuing to dose epidural or start infusion. Warning signs of high block given to the  patient including shortness of breath, tingling/numbness in hands, complete motor block, or any concerning symptoms with instructions to call for help. Patient was given instructions on fall risk and not to get out of bed. All questions and concerns addressed with instructions to call with any issues or inadequate analgesia.   Patient tolerated the insertion well without complications.  Reason for block:procedure for pain

## 2016-06-27 LAB — CBC
HCT: 34.5 % — ABNORMAL LOW (ref 35.0–47.0)
Hemoglobin: 11.8 g/dL — ABNORMAL LOW (ref 12.0–16.0)
MCH: 31.9 pg (ref 26.0–34.0)
MCHC: 34.2 g/dL (ref 32.0–36.0)
MCV: 93.2 fL (ref 80.0–100.0)
Platelets: 131 10*3/uL — ABNORMAL LOW (ref 150–440)
RBC: 3.7 MIL/uL — ABNORMAL LOW (ref 3.80–5.20)
RDW: 13.8 % (ref 11.5–14.5)
WBC: 14.2 10*3/uL — ABNORMAL HIGH (ref 3.6–11.0)

## 2016-06-27 LAB — RPR: RPR Ser Ql: NONREACTIVE

## 2016-06-27 MED ORDER — IBUPROFEN 600 MG PO TABS: 600.0000 mg | ORAL_TABLET | Freq: Four times a day (QID) | ORAL | 0 refills | Status: DC

## 2016-06-27 MED ORDER — MEDROXYPROGESTERONE ACETATE 150 MG/ML IM SUSP: 150.0000 mg | Freq: Once | INTRAMUSCULAR | Status: DC

## 2016-06-27 MED ORDER — MEDROXYPROGESTERONE ACETATE 150 MG/ML IM SUSP
150.0000 mg | Freq: Once | INTRAMUSCULAR | Status: AC
  Administered 2016-06-27: 150 mg via INTRAMUSCULAR
  Filled 2016-06-27: qty 1

## 2016-06-27 NOTE — Progress Notes (Signed)
pt discharged home.  Discharge instructions, prescriptions and follow up appointment given to and reviewed with pt.  Pt verbalized understanding, all questions answered.  Escorted by auxiliary. 

## 2016-06-27 NOTE — Anesthesia Postprocedure Evaluation (Signed)
Anesthesia Post Note  Patient: Dana Stephens  Procedure(s) Performed: * No procedures listed *  Patient location during evaluation: Mother Baby Anesthesia Type: Epidural Level of consciousness: awake and alert and oriented Pain management: pain level controlled Vital Signs Assessment: post-procedure vital signs reviewed and stable Respiratory status: spontaneous breathing Cardiovascular status: stable Postop Assessment: no headache Anesthetic complications: no    Last Vitals:  Vitals:   06/26/16 2300 06/27/16 0300  BP: 120/67 113/75  Pulse: 84 78  Resp: 18 18  Temp: 36.7 C 36.5 C    Last Pain:  Vitals:   06/27/16 0556  TempSrc:   PainSc: 3                  Arty Lantzy,  Alessandra BevelsJennifer M

## 2016-06-28 LAB — SURGICAL PATHOLOGY

## 2017-03-13 IMAGING — US US MFM FETAL NUCHAL TRANSLUCENCY
1 series · 14 of 28 positions shown · non-contrast
Comparison: none

[Series 1: us mfm fetal nuchal translucency · 0.09mm/px · 14 of 30 slices shown]
[im 2/30]
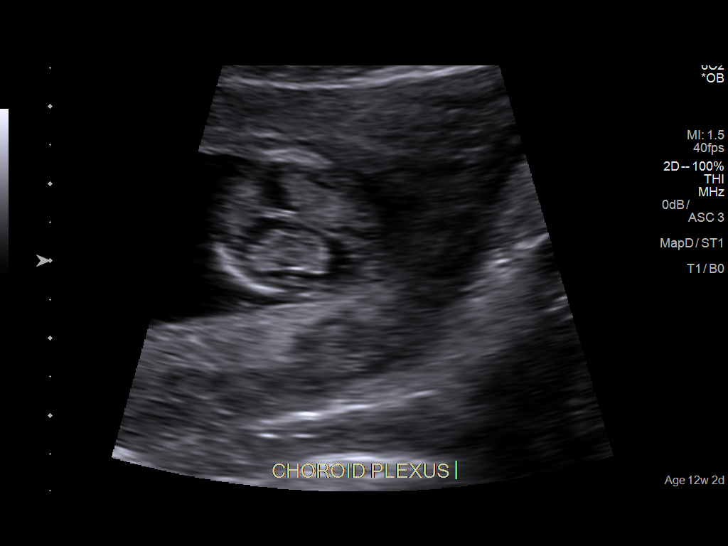
[im 4/30]
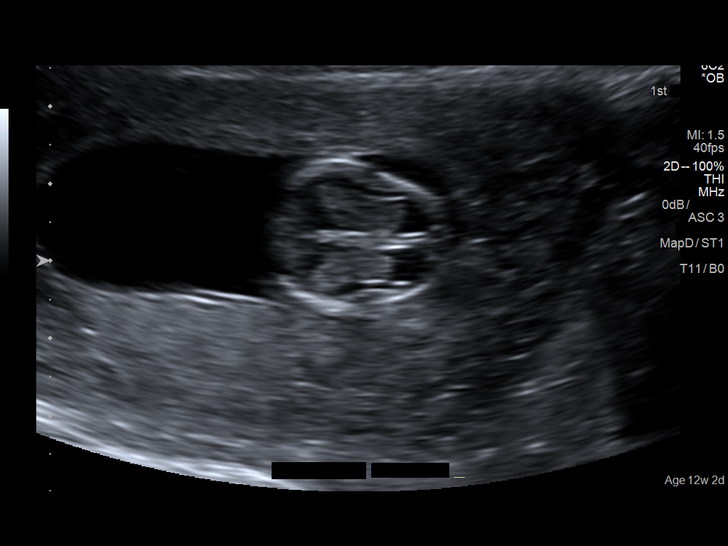
[im 6/30]
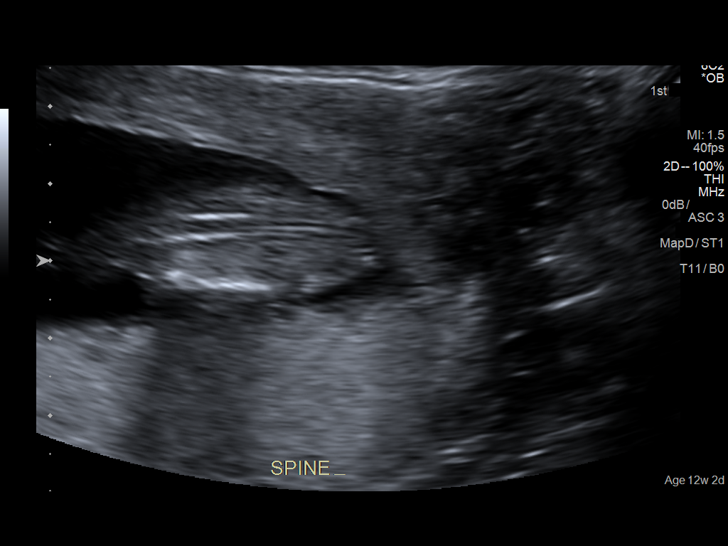
[im 8/30]
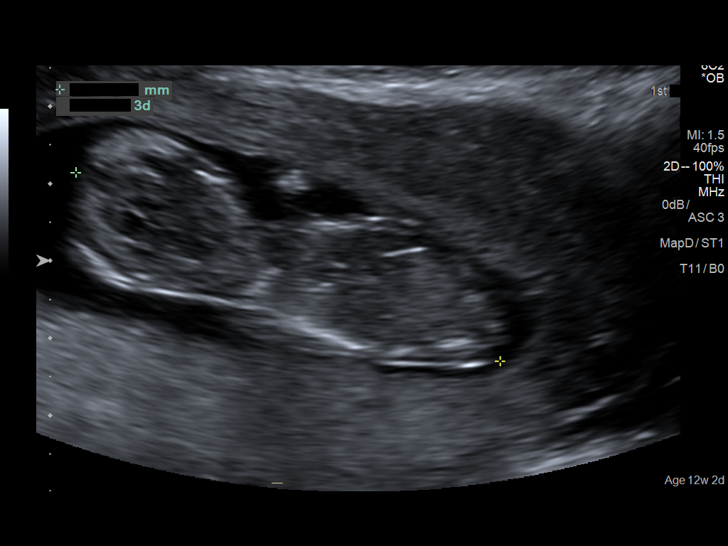
[im 10/30]
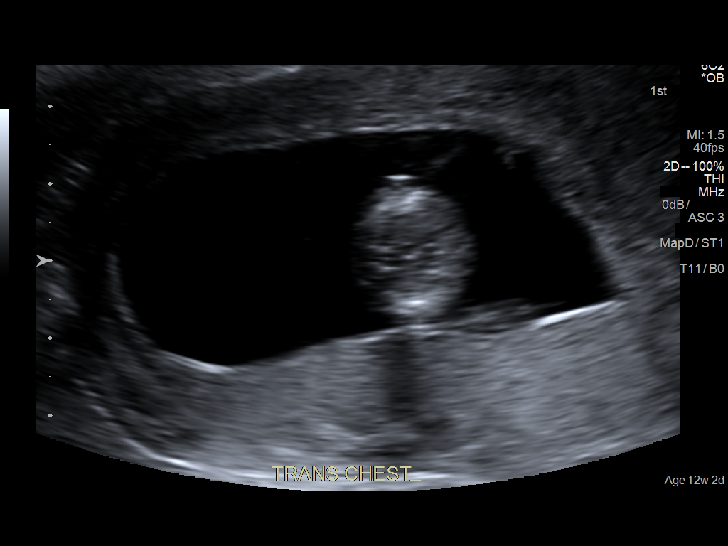
[im 12/30]
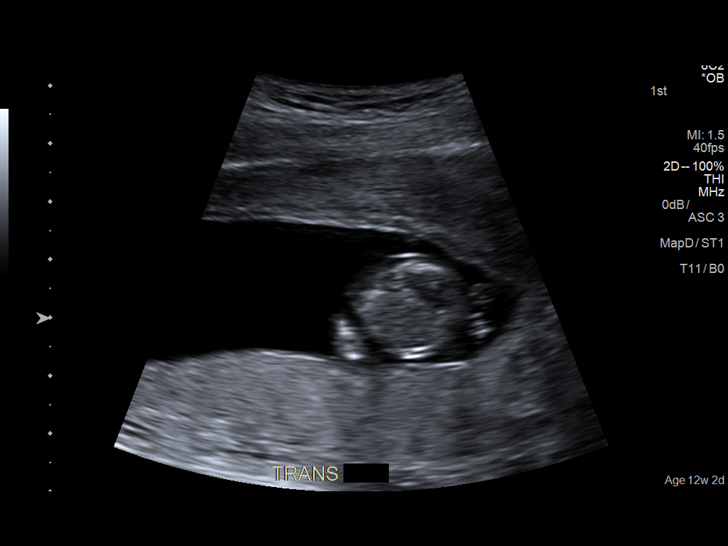
[im 14/30]
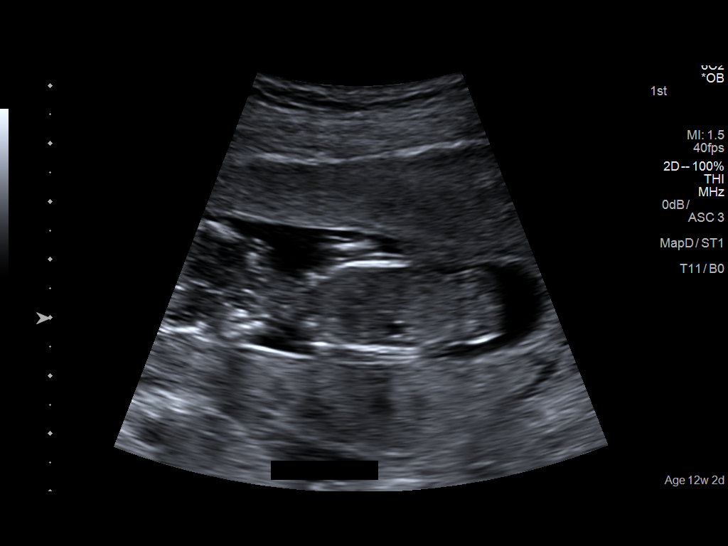
[im 17/30]
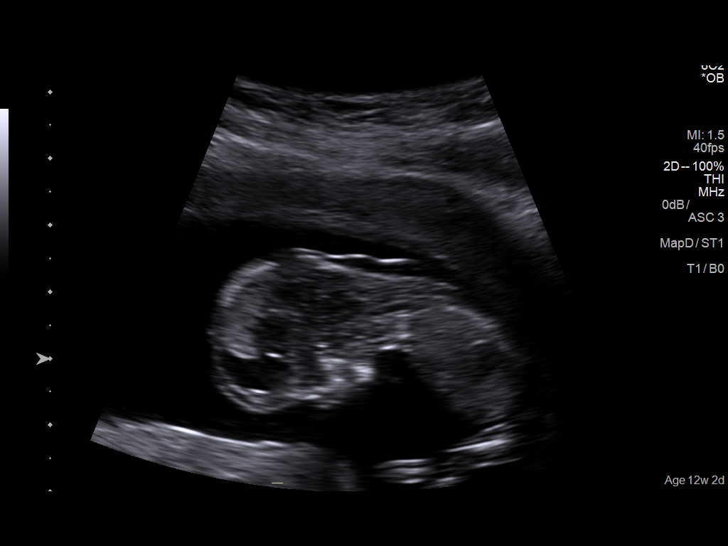
[im 19/30]
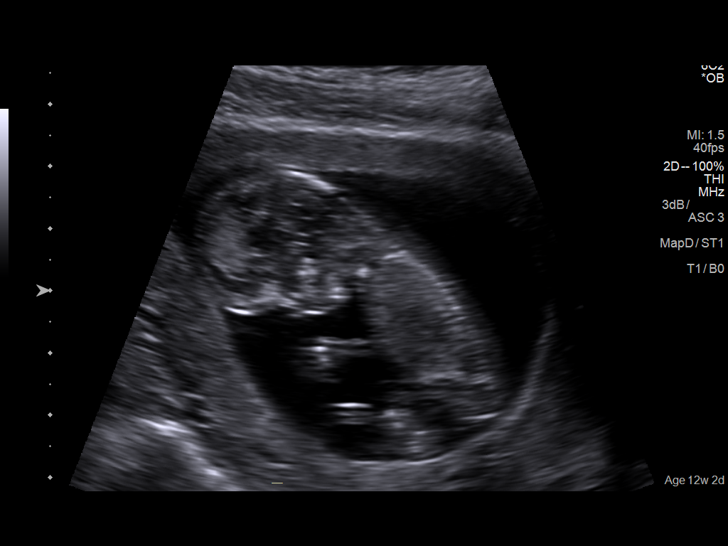
[im 21/30]
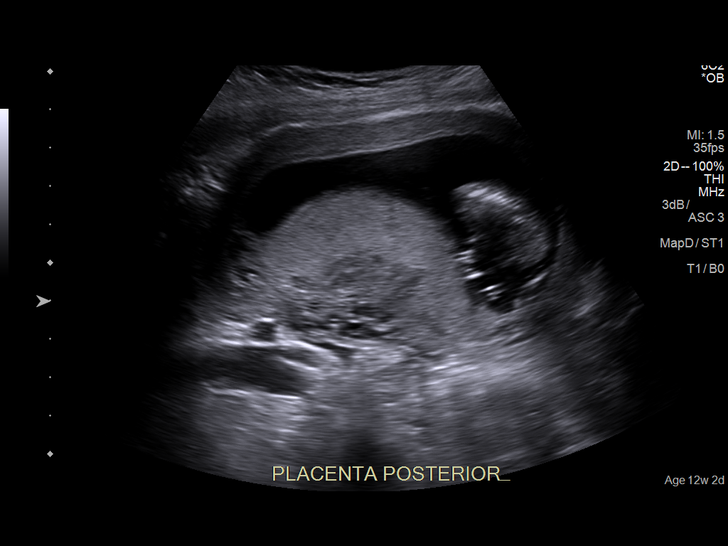
[im 23/30]
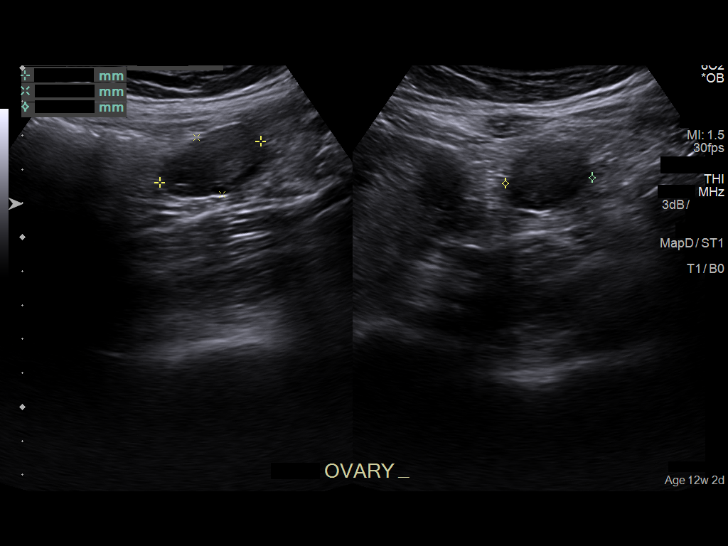
[im 25/30]
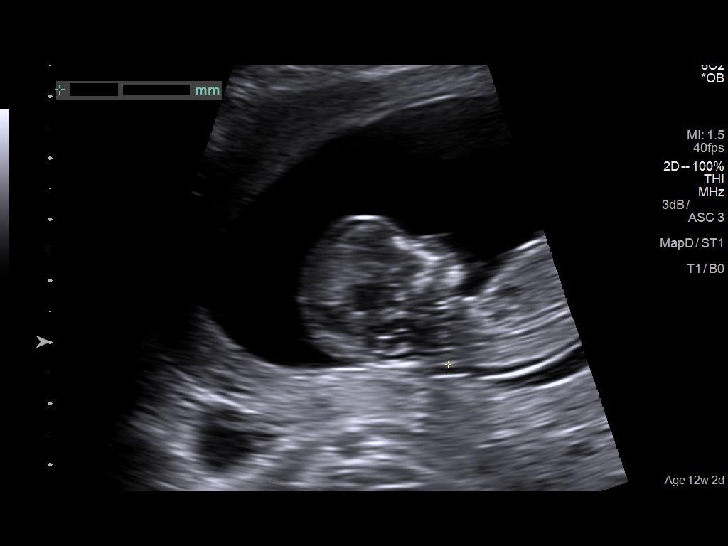
[im 27/30]
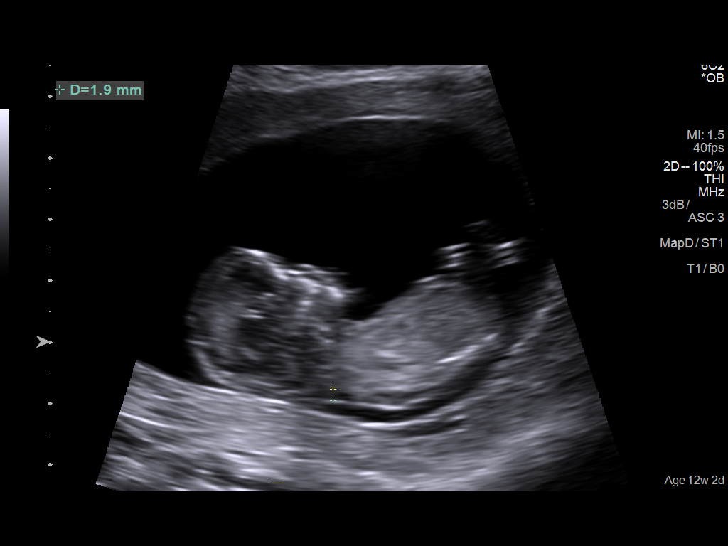
[im 30/30]
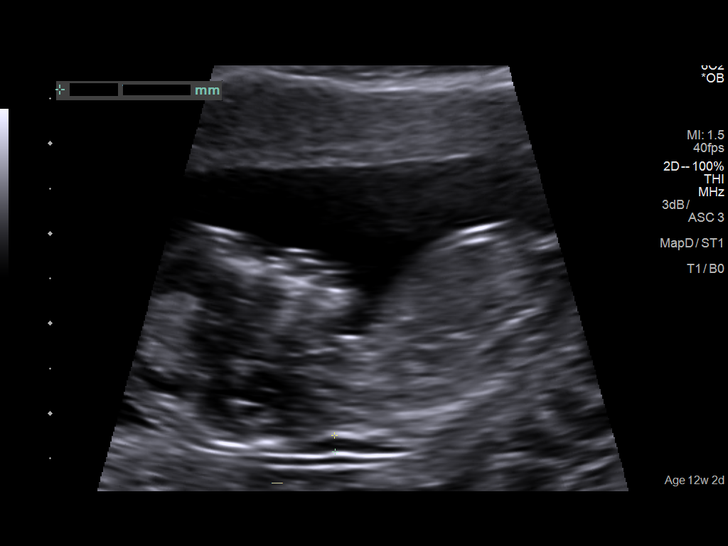

[14 of 28 positions shown; findings below may reference images not displayed]

Canned report from images found in remote index.

Refer to host system for actual result text.

## 2017-08-15 LAB — HM PAP SMEAR: HM Pap smear: NEGATIVE

## 2018-09-21 LAB — HM HIV SCREENING LAB: HM HIV Screening: NEGATIVE

## 2019-06-04 DIAGNOSIS — Z227 Latent tuberculosis: Secondary | ICD-10-CM

## 2019-06-05 ENCOUNTER — Ambulatory Visit (LOCAL_COMMUNITY_HEALTH_CENTER): Payer: Self-pay

## 2019-06-05 ENCOUNTER — Other Ambulatory Visit: Payer: Self-pay

## 2019-06-05 VITALS — BP 106/71 | Ht 60.0 in | Wt 133.0 lb

## 2019-06-05 DIAGNOSIS — Z30013 Encounter for initial prescription of injectable contraceptive: Secondary | ICD-10-CM

## 2019-06-05 DIAGNOSIS — Z3009 Encounter for other general counseling and advice on contraception: Secondary | ICD-10-CM

## 2019-06-05 MED ORDER — MEDROXYPROGESTERONE ACETATE 150 MG/ML IM SUSP
150.0000 mg | Freq: Once | INTRAMUSCULAR | Status: AC
  Administered 2019-06-05: 150 mg via INTRAMUSCULAR

## 2019-06-05 NOTE — Progress Notes (Signed)
Depo given per Wilburn Mylar FNP 09/21/2018 order. Tolerated well Aileen Fass, RN

## 2019-09-03 ENCOUNTER — Encounter: Payer: Self-pay | Admitting: Family Medicine

## 2019-09-03 ENCOUNTER — Ambulatory Visit: Payer: Self-pay | Admitting: Family Medicine

## 2019-09-03 ENCOUNTER — Other Ambulatory Visit: Payer: Self-pay

## 2019-09-03 ENCOUNTER — Ambulatory Visit: Payer: Self-pay

## 2019-09-03 VITALS — BP 110/68 | Ht 61.0 in | Wt 131.0 lb

## 2019-09-03 DIAGNOSIS — Z3042 Encounter for surveillance of injectable contraceptive: Secondary | ICD-10-CM

## 2019-09-03 DIAGNOSIS — Z113 Encounter for screening for infections with a predominantly sexual mode of transmission: Secondary | ICD-10-CM

## 2019-09-03 LAB — WET PREP FOR TRICH, YEAST, CLUE
Trichomonas Exam: NEGATIVE
Yeast Exam: NEGATIVE

## 2019-09-03 MED ORDER — MEDROXYPROGESTERONE ACETATE 150 MG/ML IM SUSP
150.0000 mg | INTRAMUSCULAR | Status: AC
  Administered 2019-09-03 – 2020-03-18 (×3): 150 mg via INTRAMUSCULAR

## 2019-09-03 NOTE — Progress Notes (Signed)
Pt here for Depo and STD screening. Pt denies any issues with Depo. Pt desires blood work today for HIV and syphillis.Ronny Bacon, RN

## 2019-09-03 NOTE — Progress Notes (Signed)
Wet mount reviewed and is negative today so no treatment needed for wet mount per standing order. Pt received Depo 150mg  IM today per provider order and pt tolerated well. Counseled pt per provider orders and pt states understanding. Provider orders completed.Ronny Bacon, RN

## 2019-09-03 NOTE — Progress Notes (Signed)
Family Planning Visit- Repeat Yearly Visit  Subjective:  Dana Stephens is a 28 y.o. being seen today for an well woman visit and to discuss family planning options.    She is currently using Depo-Provera injections for pregnancy prevention. Patient reports she does not if she or her partner wants a pregnancy in the next year. Patient  has Latent tuberculosis and History of pre-eclampsia on their problem list.  Chief Complaint  Patient presents with  . Contraception    Depo  . SEXUALLY TRANSMITTED DISEASE    STD testing    Patient reports she would like to renew depo prescription. Has been using this on/off x11 years. Patient denies problems with depo, last injection 3 months ago. Does not have menstruation.    Today she would also like STI testing. Has been having burning, pain with urination x1 month, also urinary frequency. Denies fever, n/v, vaginal discharge.   Flu vaccine: woulld like today   Does the patient desire a pregnancy in the next year? (OK[facility] flowsheet) No  See flowsheet for other program required questions.   Body mass index is 24.75 kg/m. - Patient is eligible for diabetes screening based on BMI and age >8?  no HA1C ordered? not applicable  Patient reports 1 of partners in last year. Desires STI screening?  Yes  Does the patient have a current or past history of drug use? No   No components found for: HCV]   Health Maintenance Due  Topic Date Due  . TETANUS/TDAP  10/10/2007  . INFLUENZA VACCINE  05/04/2019    ROS  The following portions of the patient's history were reviewed and updated as appropriate: allergies, current medications, past family history, past medical history, past social history, past surgical history and problem list. Problem list updated.  Objective:   Vitals:   09/03/19 1403  BP: 110/68  Weight: 131 lb (59.4 kg)  Height: 5\' 1"  (1.549 m)    Physical Exam Vitals signs and nursing note reviewed.  Constitutional:       Appearance: Normal appearance.  HENT:     Head: Normocephalic and atraumatic.     Mouth/Throat:     Mouth: Mucous membranes are moist.     Pharynx: Oropharynx is clear. No oropharyngeal exudate or posterior oropharyngeal erythema.  Pulmonary:     Effort: Pulmonary effort is normal.  Abdominal:     General: Abdomen is flat.     Palpations: There is no mass.     Tenderness: There is no abdominal tenderness. There is no right CVA tenderness, left CVA tenderness or rebound.  Genitourinary:    General: Normal vulva.     Exam position: Lithotomy position.     Pubic Area: No rash or pubic lice.      Labia:        Right: No rash or lesion.        Left: No rash or lesion.      Vagina: Vaginal discharge (scant, white) present. No erythema, bleeding or lesions.     Cervix: No cervical motion tenderness, friability, lesion or erythema.     Uterus: Normal.      Adnexa: Right adnexa normal and left adnexa normal.     Rectum: Normal.  Lymphadenopathy:     Head:     Right side of head: No preauricular or posterior auricular adenopathy.     Left side of head: No preauricular or posterior auricular adenopathy.     Cervical: No cervical adenopathy.  Upper Body:     Right upper body: No supraclavicular or axillary adenopathy.     Left upper body: No supraclavicular or axillary adenopathy.     Lower Body: No right inguinal adenopathy. No left inguinal adenopathy.  Skin:    General: Skin is warm and dry.     Findings: No rash.  Neurological:     Mental Status: She is alert and oriented to person, place, and time.       Assessment and Plan:  Dana Stephens is a 28 y.o. female presenting to the [facility] Department for an initial well woman exam/family planning visit  Contraception counseling: Reviewed all forms of birth control options in the tiered based approach. available including abstinence; over the counter/barrier methods; hormonal contraceptive medication including  pill, patch, ring, injection,contraceptive implant; hormonal and nonhormonal IUDs; permanent sterilization options including vasectomy and the various tubal sterilization modalities. Risks, benefits, and typical effectiveness rates were reviewed.   Patient was counseled on the side effect of the method chosen, the correct use of her desired method and how to discontinue in the future. Questions were answered.  Written information was also given to the patient to review.  Patient desires to continue depo despite risks to bone health with 11+ yrs use, this was prescribed for patient. She will follow up in  1 year for surveillance.  She was told to call with any further questions, or with any concerns about this method of contraception.  Emphasized use of condoms 100% of the time for STI prevention. ECP n/a - Depo is still active.    1. Encounter for surveillance of injectable contraceptive -Rx x1 yr. -Counseled about the FDA warning about bone loss on Depo Provera taken more than 2 years. Discussed other contraceptive methods, patient declined. Encouraged lifting weights, calcium (1200 mg daily) and vitamin D (800 IU daily) supplements to help with the risk of bone loss. Follow up in 3 months for repeat Depo Provera injection.  - medroxyPROGESTERone (DEPO-PROVERA) injection 150 mg  2. Screening examination for venereal disease -Exam without tenderness. Screenings today as below. Treat wet prep per standing order. Declines hiv/syphilis testing. -Discussed that GC/CT NAAT may take >1 week to result. Counseled on warning s/sx and when to seek care. Recommended condom use with all sex. -As symptoms sounds like UTI pt given handout of area primary care providers for further investigation if symptoms continue. - WET PREP FOR TRICH, YEAST, CLUE - Chlamydia/Gonorrhea [facility]      Return if symptoms worsen or fail to improve.  No future appointments.  Ann Held, PA-C

## 2019-12-05 ENCOUNTER — Ambulatory Visit: Payer: Self-pay

## 2019-12-25 ENCOUNTER — Ambulatory Visit: Payer: Self-pay

## 2019-12-27 ENCOUNTER — Ambulatory Visit (LOCAL_COMMUNITY_HEALTH_CENTER): Payer: Self-pay

## 2019-12-27 ENCOUNTER — Other Ambulatory Visit: Payer: Self-pay

## 2019-12-27 VITALS — BP 100/60 | Ht 61.0 in | Wt 131.0 lb

## 2019-12-27 DIAGNOSIS — Z30013 Encounter for initial prescription of injectable contraceptive: Secondary | ICD-10-CM

## 2019-12-27 DIAGNOSIS — Z3009 Encounter for other general counseling and advice on contraception: Secondary | ICD-10-CM

## 2019-12-27 MED ORDER — MULTI-VITAMIN/MINERALS PO TABS: 1.0000 | ORAL_TABLET | Freq: Every day | ORAL | 0 refills | Status: DC

## 2019-12-27 NOTE — Progress Notes (Signed)
Presents for Depo at 16 weeks and 4 days after last injection 09/03/2019. Encouraged to make appt every 11 - 13 weeks as ordered. Depo administered per 09/03/2019 written order of Samara Snide PA-C and client tolerated without complaint. Jossie Ng, RN  Folic acid counseling completed and MVI dispensed. Jossie Ng, RN

## 2020-03-18 ENCOUNTER — Ambulatory Visit (LOCAL_COMMUNITY_HEALTH_CENTER): Payer: Self-pay

## 2020-03-18 ENCOUNTER — Other Ambulatory Visit: Payer: Self-pay

## 2020-03-18 VITALS — BP 112/69 | Ht 61.0 in | Wt 134.5 lb

## 2020-03-18 DIAGNOSIS — Z30013 Encounter for initial prescription of injectable contraceptive: Secondary | ICD-10-CM

## 2020-03-18 DIAGNOSIS — Z3009 Encounter for other general counseling and advice on contraception: Secondary | ICD-10-CM

## 2020-03-18 MED ORDER — MULTI-VITAMIN/MINERALS PO TABS: 1.0000 | ORAL_TABLET | Freq: Every day | ORAL | 0 refills | Status: DC

## 2020-03-18 NOTE — Progress Notes (Signed)
Depo administered as per 09/03/2019 written order of Samara Snide PA-C and client tolerated without complaint. Jossie Ng, RN

## 2020-06-03 ENCOUNTER — Ambulatory Visit: Payer: Self-pay

## 2020-06-18 ENCOUNTER — Ambulatory Visit (LOCAL_COMMUNITY_HEALTH_CENTER): Payer: Self-pay

## 2020-06-18 ENCOUNTER — Other Ambulatory Visit: Payer: Self-pay

## 2020-06-18 VITALS — BP 107/67 | HR 94 | Ht 61.0 in | Wt 136.0 lb

## 2020-06-18 DIAGNOSIS — Z3009 Encounter for other general counseling and advice on contraception: Secondary | ICD-10-CM

## 2020-06-18 DIAGNOSIS — Z30013 Encounter for initial prescription of injectable contraceptive: Secondary | ICD-10-CM

## 2020-06-18 MED ORDER — MULTI-VITAMIN/MINERALS PO TABS: 1.0000 | ORAL_TABLET | Freq: Every day | ORAL | 0 refills | Status: DC

## 2020-06-18 MED ORDER — MEDROXYPROGESTERONE ACETATE 150 MG/ML IM SUSP
150.0000 mg | Freq: Once | INTRAMUSCULAR | Status: AC
  Administered 2020-06-18: 150 mg via INTRAMUSCULAR

## 2020-06-18 NOTE — Progress Notes (Signed)
Depo administered today per standing order of Dr. Alvester Morin and client tolerated injection in left deltoid without complaint. Jossie Ng, RN

## 2020-09-14 ENCOUNTER — Ambulatory Visit: Payer: Self-pay

## 2020-09-16 ENCOUNTER — Other Ambulatory Visit: Payer: Self-pay

## 2020-09-16 ENCOUNTER — Ambulatory Visit (LOCAL_COMMUNITY_HEALTH_CENTER): Payer: Self-pay | Admitting: Family Medicine

## 2020-09-16 ENCOUNTER — Encounter: Payer: Self-pay | Admitting: Family Medicine

## 2020-09-16 VITALS — BP 105/80 | HR 79 | Temp 98.4°F | Ht 61.0 in | Wt 130.8 lb

## 2020-09-16 DIAGNOSIS — B379 Candidiasis, unspecified: Secondary | ICD-10-CM

## 2020-09-16 DIAGNOSIS — Z3042 Encounter for surveillance of injectable contraceptive: Secondary | ICD-10-CM

## 2020-09-16 DIAGNOSIS — Z1272 Encounter for screening for malignant neoplasm of vagina: Secondary | ICD-10-CM

## 2020-09-16 DIAGNOSIS — Z113 Encounter for screening for infections with a predominantly sexual mode of transmission: Secondary | ICD-10-CM

## 2020-09-16 DIAGNOSIS — Z01419 Encounter for gynecological examination (general) (routine) without abnormal findings: Secondary | ICD-10-CM

## 2020-09-16 DIAGNOSIS — Z30013 Encounter for initial prescription of injectable contraceptive: Secondary | ICD-10-CM

## 2020-09-16 DIAGNOSIS — Z3009 Encounter for other general counseling and advice on contraception: Secondary | ICD-10-CM

## 2020-09-16 MED ORDER — MEDROXYPROGESTERONE ACETATE 150 MG/ML IM SUSP
150.0000 mg | INTRAMUSCULAR | Status: AC
Start: 2020-09-16 — End: 2021-09-11
  Administered 2020-09-16 – 2021-04-06 (×3): 150 mg via INTRAMUSCULAR

## 2020-09-16 MED ORDER — CLOTRIMAZOLE 1 % VA CREA: 1.0000 | TOPICAL_CREAM | Freq: Every day | VAGINAL | 0 refills | Status: AC

## 2020-09-16 NOTE — Progress Notes (Signed)
Family Planning Visit- Repeat Yearly Visit  Subjective:  Dana Stephens is a 29 y.o. [id number]  being seen today for an well woman visit and to discuss family planning options.    She is currently using Depo Provera for pregnancy prevention. Patient reports she does not want a pregnancy in the next year. Patient  has Latent tuberculosis and History of pre-eclampsia on their problem list.  Chief Complaint  Patient presents with  . Annual Exam  . Contraception    Desires Depo    Patient reports things are well, no problems or complaints  Patient denies any problems.     See flowsheet for other program required questions.   Body mass index is 24.71 kg/m. - Patient is eligible for diabetes screening based on BMI and age >78?  not applicable HA1C ordered? not applicable  Patient reports 1 of partners in last year. Desires STI screening?  Yes   Has patient been screened once for HCV in the past?  No  No results found for: HCVAB  Does the patient have current of drug use, have a partner with drug use, and/or has been incarcerated since last result? No  If yes-- Screen for HCV through [facility]   Does the patient meet criteria for HBV testing? No  Criteria:  -Household, sexual or needle sharing contact with HBV -History of drug use -HIV positive -Those with known Hep C   Health Maintenance Due  Topic Date Due  . Hepatitis C Screening  Never done  . COVID-19 Vaccine (1) Never done  . TETANUS/TDAP  Never done  . INFLUENZA VACCINE  05/03/2020  . PAP SMEAR-Modifier  08/15/2020    Review of Systems  All other systems reviewed and are negative.   The following portions of the patient's history were reviewed and updated as appropriate: allergies, current medications, past family history, past medical history, past social history, past surgical history and problem list. Problem list updated.  Objective:   Vitals:   09/16/20 1623  BP: 105/80  Pulse: 79  Temp: 98.4  F (36.9 C)  Weight: 130 lb 12.8 oz (59.3 kg)  Height: 5\' 1"  (1.549 m)    Physical Exam Vitals and nursing note reviewed.  Constitutional:      Appearance: Normal appearance.  HENT:     Head: Normocephalic and atraumatic.     Mouth/Throat:     Mouth: Mucous membranes are moist.     Pharynx: Oropharynx is clear. No oropharyngeal exudate or posterior oropharyngeal erythema.  Eyes:     General: No scleral icterus. Cardiovascular:     Rate and Rhythm: Normal rate and regular rhythm.     Pulses: Normal pulses.     Heart sounds: Normal heart sounds.  Pulmonary:     Effort: Pulmonary effort is normal.     Breath sounds: Normal breath sounds.  Abdominal:     General: Abdomen is flat. Bowel sounds are normal.     Palpations: Abdomen is soft.  Genitourinary:    Comments: External genitalia without, lice, nits, erythema, edema , lesions or inguinal adenopathy. Vagina with normal mucosa and  Thick curd like discharge and pH equals 4.  Cervix without visual lesions, uterus firm, mobile, non-tender, no masses, CMT adnexal fullness or tenderness.  Musculoskeletal:        General: Normal range of motion.     Cervical back: Normal range of motion and neck supple.  Lymphadenopathy:     Cervical: No cervical adenopathy.  Skin:  General: Skin is warm and dry.  Neurological:     General: No focal deficit present.     Mental Status: She is alert and oriented to person, place, and time.  Psychiatric:        Mood and Affect: Mood normal.        Behavior: Behavior normal.       Assessment and Plan:  Raquell Stephens is a 29 y.o. female [phone] presenting to the [facility] Department for an yearly well woman exam/family planning visit  Contraception counseling: Reviewed all forms of birth control options in the tiered based approach. available including abstinence; over the counter/barrier methods; hormonal contraceptive medication including pill, patch, ring,  injection,contraceptive implant, ECP; hormonal and nonhormonal IUDs; permanent sterilization options including vasectomy and the various tubal sterilization modalities. Risks, benefits, and typical effectiveness rates were reviewed.  Questions were answered.  Written information was also given to the patient to review.  Patient desires depo this was prescribed for patient. She will follow up in 11-13 weeks  for surveillance.  She was told to call with any further questions, or with any concerns about this method of contraception.  Emphasized use of condoms 100% of the time for STI prevention.  Patient was not offered ECP based on currently using depo provera.    1. Family planning Well woman exam today - WET PREP FOR TRICH, YEAST, CLUE - Chlamydia/Gonorrhea [facility] - IGP, Aptima HPV  2. Yeast infection Yeast present on exam .  Treat for yeast per standing order.    Discussed methods to avoid yeast infections.   - clotrimazole (V-R CLOTRIMAZOLE VAGINAL) 1 % vaginal cream; Place 1 Applicatorful vaginally at bedtime for 7 days.  Dispense: 45 g; Refill: 0  3. Screening examination for venereal disease  Patient request for screening.  Wet prep negative.  Awaiting test results.  Blood work not completed today d/t lab closed.  Patient to have HIV and RPR at next depo.    4. Encounter for surveillance of injectable contraceptive   Patient on depo and has no concerns.  Last depo was 06/18/20.  Continue with depo provera 150 mg IM every 11-13 weeks x 1 year  5. Smear, vaginal, as part of routine gynecological examination Pap with HPV completed today.  Last pap was 08/15/17- Neg.      Return in about 3 months (around 12/15/2020) for depo.  No future appointments.  Wendi Snipes, FNP

## 2020-09-16 NOTE — Progress Notes (Signed)
After signing [facility] consents, client requested interpreter. Language changed to Spanish in Epic.Depo administered per written order of Elveria Rising FNP-BC and client tolerated without complaint. Treated for yeast per verbal order of M. Lanae Boast FNP-BC (wet mount negative). Jossie Ng, RN

## 2020-09-17 LAB — WET PREP FOR TRICH, YEAST, CLUE
Trichomonas Exam: NEGATIVE
Yeast Exam: NEGATIVE

## 2020-09-21 LAB — IGP, APTIMA HPV
HPV Aptima: NEGATIVE
PAP Smear Comment: 0

## 2020-12-15 ENCOUNTER — Ambulatory Visit: Payer: Self-pay

## 2020-12-22 ENCOUNTER — Ambulatory Visit (LOCAL_COMMUNITY_HEALTH_CENTER): Payer: Self-pay

## 2020-12-22 ENCOUNTER — Other Ambulatory Visit: Payer: Self-pay

## 2020-12-22 VITALS — BP 114/76 | Ht 61.0 in | Wt 134.0 lb

## 2020-12-22 DIAGNOSIS — Z3009 Encounter for other general counseling and advice on contraception: Secondary | ICD-10-CM

## 2020-12-22 DIAGNOSIS — Z30013 Encounter for initial prescription of injectable contraceptive: Secondary | ICD-10-CM

## 2020-12-22 NOTE — Progress Notes (Signed)
13 weeks 6 days post depo.Voices no concerns. Declined interpreter today and she reports she understands what RN is saying.  Depo given today per order by Elveria Rising, FNP dated 09/16/2020. Depo given R Deltoid per pt preference and tolerated well. Next depo due 03/09/2021, pt aware. Jerel Shepherd, RN

## 2021-03-26 ENCOUNTER — Ambulatory Visit: Payer: Self-pay

## 2021-04-06 ENCOUNTER — Ambulatory Visit (LOCAL_COMMUNITY_HEALTH_CENTER): Payer: Self-pay

## 2021-04-06 ENCOUNTER — Other Ambulatory Visit: Payer: Self-pay

## 2021-04-06 VITALS — BP 107/71 | Ht 61.0 in | Wt 130.5 lb

## 2021-04-06 DIAGNOSIS — Z30013 Encounter for initial prescription of injectable contraceptive: Secondary | ICD-10-CM

## 2021-04-06 DIAGNOSIS — Z3009 Encounter for other general counseling and advice on contraception: Secondary | ICD-10-CM

## 2021-04-06 NOTE — Progress Notes (Signed)
15 weeks post depo. Voices no concerns. Depo given today per order by Elveria Rising, FNP dated 09/16/2020. Tolerated well Left deltoid. Next depo due 06/22/2021, pt has reminder. Jerel Shepherd, RN

## 2021-09-14 ENCOUNTER — Other Ambulatory Visit: Payer: Self-pay

## 2021-09-14 ENCOUNTER — Emergency Department
Admission: EM | Admit: 2021-09-14 | Discharge: 2021-09-14 | Disposition: A | Discharge status: Home / Self Care | Payer: PRIVATE HEALTH INSURANCE | Attending: Emergency Medicine | Admitting: Emergency Medicine

## 2021-09-14 ENCOUNTER — Emergency Department: Payer: PRIVATE HEALTH INSURANCE

## 2021-09-14 DIAGNOSIS — R1013 Epigastric pain: Secondary | ICD-10-CM

## 2021-09-14 DIAGNOSIS — K2900 Acute gastritis without bleeding: Secondary | ICD-10-CM

## 2021-09-14 LAB — CBC
HCT: 37 % (ref 36.0–46.0)
Hemoglobin: 12.4 g/dL (ref 12.0–15.0)
MCH: 31.4 pg (ref 26.0–34.0)
MCHC: 33.5 g/dL (ref 30.0–36.0)
MCV: 93.7 fL (ref 80.0–100.0)
Platelets: 263 10*3/uL (ref 150–400)
RBC: 3.95 MIL/uL (ref 3.87–5.11)
RDW: 11.9 % (ref 11.5–15.5)
WBC: 10.3 10*3/uL (ref 4.0–10.5)
nRBC: 0 % (ref 0.0–0.2)

## 2021-09-14 LAB — COMPREHENSIVE METABOLIC PANEL
ALT: 23 U/L (ref 0–44)
AST: 24 U/L (ref 15–41)
Albumin: 4.3 g/dL (ref 3.5–5.0)
Alkaline Phosphatase: 90 U/L (ref 38–126)
Anion gap: 3 — ABNORMAL LOW (ref 5–15)
BUN: 13 mg/dL (ref 6–20)
CO2: 27 mmol/L (ref 22–32)
Calcium: 8.8 mg/dL — ABNORMAL LOW (ref 8.9–10.3)
Chloride: 105 mmol/L (ref 98–111)
Creatinine, Ser: 0.71 mg/dL (ref 0.44–1.00)
GFR, Estimated: >60 mL/min (ref >60)
Glucose, Bld: 99 mg/dL (ref 70–99)
Potassium: 4.1 mmol/L (ref 3.5–5.1)
Sodium: 135 mmol/L (ref 135–145)
Total Bilirubin: 0.5 mg/dL (ref 0.3–1.2)
Total Protein: 7.3 g/dL (ref 6.5–8.1)

## 2021-09-14 LAB — URINALYSIS, ROUTINE W REFLEX MICROSCOPIC
Bilirubin Urine: NEGATIVE
Glucose, UA: NEGATIVE mg/dL
Hgb urine dipstick: NEGATIVE
Ketones, ur: NEGATIVE mg/dL
Leukocytes,Ua: NEGATIVE
Nitrite: NEGATIVE
Protein, ur: NEGATIVE mg/dL
Specific Gravity, Urine: 1.02 (ref 1.005–1.030)
pH: 7.5 (ref 5.0–8.0)

## 2021-09-14 LAB — LIPASE, BLOOD: Lipase: 41 U/L (ref 11–51)

## 2021-09-14 LAB — POC URINE PREG, ED: Preg Test, Ur: NEGATIVE

## 2021-09-14 MED ORDER — LIDOCAINE VISCOUS HCL 2 % MT SOLN: 15.0000 mL | OROMUCOSAL | 0 refills | Status: DC | PRN

## 2021-09-14 MED ORDER — OMEPRAZOLE MAGNESIUM 20 MG PO TBEC: 20.0000 mg | DELAYED_RELEASE_TABLET | Freq: Every day | ORAL | 1 refills | Status: DC

## 2021-09-14 NOTE — ED Provider Notes (Signed)
Emergency Medicine Provider Triage Evaluation Note  History limited by Spanish language.  Tele-interpreter used for interview and exam.  Dana Stephens, a 30 y.o. female  was evaluated in triage.  Pt complains of generalized abdominal pain.  She reports 1 month of intermittent "swelling"  to the lower abdomen.  She reports bloating that is worse at night.  She denies any associated nausea, vomiting, diarrhea.  She would also endorse some epigastric abdominal pain as well.  Review of Systems  Positive: Generalized abdominal pain and bloating Negative: NVD  Physical Exam  BP 122/76    Pulse 97    Temp 98.6 F (37 C) (Oral)    Resp 20    Ht 5' (1.524 m)    Wt 61.2 kg    LMP 09/07/2021    SpO2 100%    BMI 26.37 kg/m  Gen:   Awake, no distress  NAD Resp:  Normal effort CTA MSK:   Moves extremities without difficulty  Other:  ABD: soft, nontender, distended  Medical Decision Making  Medically screening exam initiated at 5:56 PM.  Appropriate orders placed.  Dana Stephens was informed that the remainder of the evaluation will be completed by another provider, this initial triage assessment does not replace that evaluation, and the importance of remaining in the ED until their evaluation is complete.  Female patient with ED evaluation of generalized abdominal discomfort and bloating for the last month.  She denies any nausea, vomiting, or dizziness.   Lissa Hoard, PA-C 09/14/21 1759    Gilles Chiquito, MD 09/14/21 [phone]

## 2021-09-14 NOTE — ED Provider Notes (Signed)
[facility] Emergency Department Provider Note   ____________________________________________   Event Date/Time   First MD Initiated Contact with Patient 09/14/21 2021     (approximate)  I have reviewed the triage vital signs and the nursing notes.   HISTORY  Chief Complaint Abdominal Pain    HPI Dana Stephens is a 30 y.o. female with past medical history of latent TB and preeclampsia who presents to the ED complaining of abdominal pain.  History is limited as patient is Spanish-speaking only and history obtained via interpreter [phone].  Patient reports that she has been dealing with 1 month of intermittent pain in her epigastrium that she describes as sharp.  Pain is exacerbated when she goes to lay flat at night and she states it makes it very hard for her to sleep.  She denies any associated nausea or vomiting, but does state that sometimes eating will worsen her pain.  She has not had any diarrhea and denies any blood in her stool.  She denies any fevers, dysuria, hematuria, or flank pain.  She has been taking ibuprofen for her pain at home on occasion without significant relief.  She denies any prior abdominal surgeries.        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 Authorizing Provider  lidocaine (XYLOCAINE) 2 % solution Use as directed 15 mLs in the mouth or throat as needed for mouth pain. 09/14/21  Yes Chesley Noon, MD  omeprazole (PRILOSEC OTC) 20 MG tablet Take 1 tablet (20 mg total) by mouth daily. 09/14/21 09/14/22 Yes Chesley Noon, MD  ibuprofen (ADVIL,MOTRIN) 600 MG tablet  Take 1 tablet (600 mg total) by mouth every 6 (six) hours. Patient not taking: No sig reported 06/27/16   Schermerhorn, Ihor Austin, MD  Multiple Vitamins-Minerals (MULTIVITAMIN WITH MINERALS) tablet Take 1 tablet by mouth daily. Patient not taking: Reported on 04/06/2021 06/18/20   Federico Flake, MD    Allergies Patient has no known allergies.  Family History  Problem Relation Age of Onset   Cancer Maternal Grandfather     Social History Social History   Tobacco Use   Smoking status: Never   Smokeless tobacco: Never  Vaping Use   Vaping Use: Never used  Substance Use Topics   Alcohol use: Never   Drug use: Never    Review of Systems  Constitutional: No fever/chills Eyes: No visual changes. ENT: No sore throat. Cardiovascular: Denies chest pain. Respiratory: Denies shortness of breath. Gastrointestinal: Positive for abdominal pain.  No nausea, no vomiting.  No diarrhea.  No constipation. Genitourinary: Negative for dysuria. Musculoskeletal: Negative for back pain. Skin: Negative for rash. Neurological: Negative for headaches, focal weakness or numbness.  ____________________________________________   PHYSICAL EXAM:  VITAL SIGNS: ED Triage Vitals  Enc Vitals Group     BP 09/14/21 1741 122/76     Pulse Rate 09/14/21 1741 97     Resp 09/14/21 1741 20     Temp 09/14/21 1741 98.6 F (37 C)     Temp Source 09/14/21 1741 Oral     SpO2 09/14/21 1741 100 %  Weight 09/14/21 1742 135 lb (61.2 kg)     Height 09/14/21 1742 5' (1.524 m)     Head Circumference --      Peak Flow --      Pain Score 09/14/21 1742 5     Pain Loc --      Pain Edu? --      Excl. in GC? --     Constitutional: Alert and oriented. Eyes: Conjunctivae are normal. Head: Atraumatic. Nose: No congestion/rhinnorhea. Mouth/Throat: Mucous membranes are moist. Neck: Normal ROM Cardiovascular: Normal rate, regular rhythm. Grossly normal heart sounds.  2+ radial pulses  bilaterally. Respiratory: Normal respiratory effort.  No retractions. Lungs CTAB. Gastrointestinal: Soft and nontender. No distention. Genitourinary: deferred Musculoskeletal: No lower extremity tenderness nor edema. Neurologic:  Normal speech and language. No gross focal neurologic deficits are appreciated. Skin:  Skin is warm, dry and intact. No rash noted. Psychiatric: Mood and affect are normal. Speech and behavior are normal.  ____________________________________________   LABS (all labs ordered are listed, but only abnormal results are displayed)  Labs Reviewed  COMPREHENSIVE METABOLIC PANEL - Abnormal; Notable for the following components:      Result Value   Calcium 8.8 (*)    Anion gap 3 (*)    All other components within normal limits  POC URINE PREG, ED - Normal  LIPASE, BLOOD  CBC  URINALYSIS, ROUTINE W REFLEX MICROSCOPIC   ____________________________________________  EKG  ED ECG REPORT I, Chesley Noon, the attending physician, personally viewed and interpreted this ECG.   Date: 09/14/2021  EKG Time: 17:48  Rate: 91  Rhythm: normal sinus rhythm  Axis: Normal  Intervals:none  ST&T Change: None   PROCEDURES  Procedure(s) performed (including Critical Care):  Procedures   ____________________________________________   INITIAL IMPRESSION / ASSESSMENT AND PLAN / ED COURSE      30 year old female with past medical history of latent TB and preeclampsia presents to the ED complaining of intermittent epigastric pain for the past month that is worse when she lays flat and occasionally when she goes to eat.  Patient reports minimal pain at this time and has no tenderness on exam.  Pregnancy testing is negative and UA shows no signs of infection.  Labs are unremarkable, LFTs and lipase are within normal limits.  EKG shows no evidence of arrhythmia or ischemia and I doubt cardiac etiology.  CT scan was performed from triage and negative for acute process,  patient's symptoms sound most consistent with a gastritis and she is appropriate for outpatient management with omeprazole and viscous lidocaine as needed.  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  Patient was notified of this finding, provided with referral to establish care with PCP along with referral to oncology.  She was counseled to return to the ED for new worsening symptoms, patient agrees with plan.        ____________________________________________   FINAL CLINICAL IMPRESSION(S) / ED DIAGNOSES  Final diagnoses:  Epigastric pain  Acute gastritis without hemorrhage, unspecified gastritis type     ED Discharge Orders          Ordered    omeprazole (PRILOSEC OTC) 20 MG tablet  Daily        09/14/21 2223    lidocaine (XYLOCAINE) 2 % solution  As needed        09/14/21 2223             Note:  This document  was prepared using Systems analyst and may include unintentional dictation errors.    Blake Divine, MD 09/14/21 2225

## 2021-09-14 NOTE — ED Triage Notes (Signed)
Interpreter # [phone] used Pt reports "swelling" to abdomen for 1 month. Reports bloating worse at night. C/o upper abd pain Denies n/v/d Jenise MSE in triage

## 2021-10-08 ENCOUNTER — Encounter: Payer: Self-pay | Admitting: *Deleted

## 2021-10-10 DIAGNOSIS — R222 Localized swelling, mass and lump, trunk: Secondary | ICD-10-CM | POA: Insufficient documentation

## 2021-10-10 NOTE — Progress Notes (Signed)
[facility]  Telephone:([phone] [phone]  ID: Kennita Pavlovich OB: 06/12/1991  MR#: [id number]  UUV#:[id number]  Patient Care Team: Patient, No Pcp Per (Inactive) as PCP - General (General Practice) Jeralyn Ruths, MD as Consulting Physician (Hematology and Oncology)  CHIEF COMPLAINT: Right paravertebral mass.  INTERVAL HISTORY: Patient is a 31 year old female who initially presented to the emergency room with abdominal pain.  Subsequent imaging noted incidentally a right paravertebral mass.  Her abdominal pain has resolved and she is now asymptomatic.  She has no neurologic complaints.  She denies any recent fevers or illnesses.  She has a good appetite and denies weight loss.  She has no chest pain, shortness of breath, cough, or hemoptysis.  She denies any nausea, vomiting, constipation, or diarrhea.  She has no urinary complaints.  Patient feels at her baseline and offers no specific complaints today.  REVIEW OF SYSTEMS:   Review of Systems  Constitutional: Negative.  Negative for fever, malaise/fatigue and weight loss.  Respiratory: Negative.  Negative for cough, hemoptysis and shortness of breath.   Cardiovascular: Negative.  Negative for chest pain and leg swelling.  Gastrointestinal: Negative.  Negative for abdominal pain.  Genitourinary: Negative.  Negative for dysuria.  Musculoskeletal: Negative.  Negative for back pain.  Skin: Negative.  Negative for rash.  Neurological: Negative.  Negative for dizziness, focal weakness, weakness and headaches.  Psychiatric/Behavioral: Negative.  The patient is not nervous/anxious.    As per HPI. Otherwise, a complete review of systems is negative.  PAST MEDICAL HISTORY: Past Medical History:  Diagnosis Date   Acute gastritis without hemorrhage, unspecified gastritis type    History of pre-eclampsia    History of spontaneous abortion    Medical history non-contributory    Paravertebral mass     right paravertebral mass at T11-T12   TB lung, latent    history of latent TB    PAST SURGICAL HISTORY: Past Surgical History:  Procedure Laterality Date   CESAREAN SECTION  2009    FAMILY HISTORY: Family History  Problem Relation Age of Onset   Cancer Maternal Grandfather     ADVANCED DIRECTIVES (Y/N):  N  HEALTH MAINTENANCE: Social History   Tobacco Use   Smoking status: Never    Passive exposure: Never   Smokeless tobacco: Never  Vaping Use   Vaping Use: Never used  Substance Use Topics   Alcohol use: Never   Drug use: Never     Colonoscopy:  PAP:  Bone density:  Lipid panel:  No Known Allergies  No current outpatient medications on file.   No current facility-administered medications for this visit.    OBJECTIVE: Vitals:   10/13/21 0927  BP: 117/76  Pulse: 86  Resp: 14  Temp: (!) 96.9 F (36.1 C)  SpO2: 100%     Body mass index is 26.17 kg/m.    ECOG FS:0 - Asymptomatic  General: Well-developed, well-nourished, no acute distress. Eyes: Pink conjunctiva, anicteric sclera. HEENT: Normocephalic, moist mucous membranes. Lungs: No audible wheezing or coughing. Heart: Regular rate and rhythm. Abdomen: Soft, nontender, no obvious distention. Musculoskeletal: No edema, cyanosis, or clubbing. Neuro: Alert, answering all questions appropriately. Cranial nerves grossly intact. Skin: No rashes or petechiae noted. Psych: Normal affect. Lymphatics: No cervical, calvicular, axillary or inguinal LAD.   LAB RESULTS:  Lab Results  Component Value Date   NA 135 09/14/2021   K 4.1 09/14/2021   CL 105 09/14/2021   CO2 27 09/14/2021   GLUCOSE 99  09/14/2021   BUN 13 09/14/2021   CREATININE 0.71 09/14/2021   CALCIUM 8.8 (L) 09/14/2021   PROT 7.3 09/14/2021   ALBUMIN 4.3 09/14/2021   AST 24 09/14/2021   ALT 23 09/14/2021   ALKPHOS 90 09/14/2021   BILITOT 0.5 09/14/2021   GFRNONAA >60 09/14/2021   GFRAA >60 06/26/2016    Lab Results  Component  Value Date   WBC 10.3 09/14/2021   HGB 12.4 09/14/2021   HCT 37.0 09/14/2021   MCV 93.7 09/14/2021   PLT 263 09/14/2021     STUDIES: CT ABDOMEN PELVIS WO CONTRAST  Result Date: 09/14/2021 CLINICAL DATA:  Generalized abdominal pain EXAM: CT ABDOMEN AND PELVIS WITHOUT CONTRAST TECHNIQUE: Multidetector CT imaging of the abdomen and pelvis was performed following the standard protocol without IV contrast. COMPARISON:  None. FINDINGS: Lower chest: Lung bases demonstrate no acute consolidation or effusion. Trace fluid at the right pericardium versus small pericardial cyst, series 2, image 8. Hepatobiliary: No focal liver abnormality is seen. No gallstones, gallbladder wall thickening, or biliary dilatation. Pancreas: Unremarkable. No pancreatic ductal dilatation or surrounding inflammatory changes. Spleen: Normal in size without focal abnormality. Adrenals/Urinary Tract: Adrenal glands are unremarkable. Kidneys are normal, without renal calculi, focal lesion, or hydronephrosis. Bladder is unremarkable. Stomach/Bowel: Stomach is within normal limits. Appendix appears normal. No evidence of bowel wall thickening, distention, or inflammatory changes. Vascular/Lymphatic: No significant vascular findings are present. No enlarged abdominal or pelvic lymph nodes. Reproductive: Uterus unremarkable.  No suspicious adnexal lesions. Other: Negative for pelvic effusion or free air Musculoskeletal: 4 x 3 by 3.3 cm right paravertebral mass, series 2, image 18 at the level of T11-T12, appears to arise from the right foramen at T11-T12. smaller mass measuring 2 by 1.1 cm between the right eleventh and twelfth posterior ribs, series 2, image 25. IMPRESSION: 1. No CT evidence for acute intra-abdominal or pelvic abnormality. 2. 4 cm right paravertebral mass at T11-T12 with smaller oval mass between the right eleventh and twelfth posterior ribs. Suspect findings are related to nerve sheath tumor or other neurogenic tumor.  Further evaluation with MRI with and without contrast is recommended, this may be performed non emergently unless otherwise clinically indicated. Electronically Signed   By: Jasmine Pang M.D.   On: 09/14/2021 18:42    ASSESSMENT: Right paravertebral mass.  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  Will get an MRI to further evaluate.  Patient does not require biopsy at this time.  No other interventions are needed.  Return to clinic 1 to 2 days after her biopsy to discuss the results.  I spent a total of 45 minutes reviewing chart data, face-to-face evaluation with the patient, counseling and coordination of care as detailed above.   Patient expressed understanding and was in agreement with this plan. She also understands that She can call clinic at any time with any questions, concerns, or complaints.    Cancer Staging  No matching staging information was found for the patient.  Jeralyn Ruths, MD   10/14/2021 2:42 PM

## 2021-10-13 ENCOUNTER — Other Ambulatory Visit: Payer: Self-pay

## 2021-10-13 ENCOUNTER — Inpatient Hospital Stay: Payer: 59 | Attending: Oncology | Admitting: Oncology

## 2021-10-13 ENCOUNTER — Inpatient Hospital Stay: Payer: 59

## 2021-10-13 ENCOUNTER — Encounter: Payer: Self-pay | Admitting: Oncology

## 2021-10-13 DIAGNOSIS — R222 Localized swelling, mass and lump, trunk: Secondary | ICD-10-CM | POA: Insufficient documentation

## 2021-10-13 DIAGNOSIS — Z809 Family history of malignant neoplasm, unspecified: Secondary | ICD-10-CM | POA: Diagnosis not present

## 2021-10-13 NOTE — Progress Notes (Signed)
New patient referred by Dr Larinda Buttery for spinal mass.

## 2021-10-21 NOTE — Progress Notes (Signed)
[facility]  Telephone:([phone]) [phone] [phone]  ID: Dana Stephens OB: 09/01/1991  MR#: [id number]  AOZ#:[id number]#:[id number]  Patient Care Team: Patient, No Pcp Per (Inactive) as PCP - General (General Practice) Jeralyn RuthsFinnegan, Khanh Tanori J, MD as Consulting Physician (Hematology and Oncology)  CHIEF COMPLAINT: Benign nerve sheath tumor.  INTERVAL HISTORY: Patient returns to clinic today for further evaluation and discussion of her imaging results.  She currently feels well and is asymptomatic.  She has no neurologic complaints.  She denies any recent fevers or illnesses.  She has a good appetite and denies weight loss.  She has no chest pain, shortness of breath, cough, or hemoptysis.  She denies any nausea, vomiting, constipation, or diarrhea.  She has no urinary complaints.  Patient offers no specific complaints today.  REVIEW OF SYSTEMS:   Review of Systems  Constitutional: Negative.  Negative for fever, malaise/fatigue and weight loss.  Respiratory: Negative.  Negative for cough, hemoptysis and shortness of breath.   Cardiovascular: Negative.  Negative for chest pain and leg swelling.  Gastrointestinal: Negative.  Negative for abdominal pain.  Genitourinary: Negative.  Negative for dysuria.  Musculoskeletal: Negative.  Negative for back pain.  Skin: Negative.  Negative for rash.  Neurological: Negative.  Negative for dizziness, focal weakness, weakness and headaches.  Psychiatric/Behavioral: Negative.  The patient is not nervous/anxious.    As per HPI. Otherwise, a complete review of systems is negative.  PAST MEDICAL HISTORY: Past Medical History:  Diagnosis Date   Acute gastritis without hemorrhage, unspecified gastritis type    History of pre-eclampsia    History of spontaneous abortion    Medical history non-contributory    Paravertebral mass    right paravertebral mass at T11-T12   TB lung, latent    history of latent TB    PAST SURGICAL HISTORY: Past  Surgical History:  Procedure Laterality Date   CESAREAN SECTION  2009    FAMILY HISTORY: Family History  Problem Relation Age of Onset   Cancer Maternal Grandfather     ADVANCED DIRECTIVES (Y/N):  N  HEALTH MAINTENANCE: Social History   Tobacco Use   Smoking status: Never    Passive exposure: Never   Smokeless tobacco: Never  Vaping Use   Vaping Use: Never used  Substance Use Topics   Alcohol use: Never   Drug use: Never     Colonoscopy:  PAP:  Bone density:  Lipid panel:  No Known Allergies  No current outpatient medications on file.   No current facility-administered medications for this visit.    OBJECTIVE: Vitals:   10/27/21 1040  BP: 109/67  Pulse: 83  Temp: (!) 97 F (36.1 C)     Body mass index is 25.78 kg/m.    ECOG FS:0 - Asymptomatic  General: Well-developed, well-nourished, no acute distress. Eyes: Pink conjunctiva, anicteric sclera. HEENT: Normocephalic, moist mucous membranes. Lungs: No audible wheezing or coughing. Heart: Regular rate and rhythm. Abdomen: Soft, nontender, no obvious distention. Musculoskeletal: No edema, cyanosis, or clubbing. Neuro: Alert, answering all questions appropriately. Cranial nerves grossly intact. Skin: No rashes or petechiae noted. Psych: Normal affect.  LAB RESULTS:  Lab Results  Component Value Date   NA 135 09/14/2021   K 4.1 09/14/2021   CL 105 09/14/2021   CO2 27 09/14/2021   GLUCOSE 99 09/14/2021   BUN 13 09/14/2021   CREATININE 0.71 09/14/2021   CALCIUM 8.8 (L) 09/14/2021   PROT 7.3 09/14/2021   ALBUMIN 4.3 09/14/2021   AST 24 09/14/2021  ALT 23 09/14/2021   ALKPHOS 90 09/14/2021   BILITOT 0.5 09/14/2021   GFRNONAA >60 09/14/2021   GFRAA >60 06/26/2016    Lab Results  Component Value Date   WBC 10.3 09/14/2021   HGB 12.4 09/14/2021   HCT 37.0 09/14/2021   MCV 93.7 09/14/2021   PLT 263 09/14/2021     STUDIES: MR Thoracic Spine W Wo Contrast  Result Date:  10/23/2021 CLINICAL DATA:  Incidental paravertebral mass on recent CT abdomen dated 09/14/2021 EXAM: MRI THORACIC WITHOUT AND WITH CONTRAST TECHNIQUE: Multiplanar and multiecho pulse sequences of the thoracic spine were obtained without and with intravenous contrast. CONTRAST:  84mL GADAVIST GADOBUTROL 1 MMOL/ML IV SOLN COMPARISON:  CT abdomen 09/14/2021 FINDINGS: Alignment:  Physiologic. Vertebrae: No fracture, evidence of discitis, or bone lesion. Cord:  Normal signal and morphology. Paraspinal and other soft tissues: Large well-circumscribed enhancing paravertebral soft tissue mass to the right of the T11-12 neural foramen measuring 3.6 x 2.7 x 3.7 cm with an additional 1.7 x 1.3 x 1.5 cm component extending into the neural foramen and into the spinal canal deforming the thecal sac towards the left. Mild remodeling and widening of the T11-12 neural foramen. Additional 2 x 1.5 cm intercostal mass more peripherally between the T11-12 posterior ribs. Disc levels: Disc spaces:  Disc spaces are maintained. T1-T2: No disc protrusion, foraminal stenosis or central canal stenosis. T2-T3: No disc protrusion, foraminal stenosis or central canal stenosis. T3-T4: No disc protrusion, foraminal stenosis or central canal stenosis. T4-T5: No disc protrusion, foraminal stenosis or central canal stenosis. T5-T6: No disc protrusion, foraminal stenosis or central canal stenosis. T6-T7: No disc protrusion, foraminal stenosis or central canal stenosis. T7-T8: No disc protrusion, foraminal stenosis or central canal stenosis. T8-T9: No disc protrusion, foraminal stenosis or central canal stenosis. T9-T10: No disc protrusion, foraminal stenosis or central canal stenosis. T10-T11: No disc protrusion, foraminal stenosis or central canal stenosis. T11-T12: Large right paraspinal mass extending into the right neural foramen as detailed above with severe right foraminal stenosis. Moderate spinal stenosis with the thoracic spinal cord displaced  towards the left. No left foraminal stenosis. L2-3: Tiny right paracentral disc protrusion. No foraminal or central canal stenosis. IMPRESSION: Large well-circumscribed enhancing paravertebral dumbbell-shaped soft tissue mass to the right of the T11-12 neural foramen measuring 3.6 x 2.7 x 3.7 cm with an additional 1.7 x 1.5 cm component extending into the neural foramen and into the spinal canal deforming the thecal sac towards the left. Mild remodeling and widening of the T11-12 neural foramen. Additional 2 x 1.5 cm intercostal mass more peripherally between the T11-12 posterior ribs. 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 Electronically Signed   By: Elige Ko M.D.   On: 10/23/2021 06:58    ASSESSMENT: Benign nerve sheath tumor.  PLAN:    Benign nerve sheath tumor: MRI results from October 22, 2021 reviewed independently and reported as above indicating that the T11-12 neuroforamen mass is consistent with benign nerve sheath tumor.  No further intervention is needed.  Patient does not require biopsy.  No further imaging is necessary unless patient becomes symptomatic.  No further follow-up has been scheduled.   The entire visit was not done in the presence of an interpreter.  I spent a total of 20 minutes reviewing chart data, face-to-face evaluation with the patient, counseling and coordination of care as detailed above.    Patient expressed understanding and was in agreement with this plan. She also understands that She can call  clinic at any time with any questions, concerns, or complaints.    Jeralyn Ruths, MD   10/27/2021 8:51 PM

## 2021-10-22 ENCOUNTER — Ambulatory Visit
Admission: RE | Admit: 2021-10-22 | Discharge: 2021-10-22 | Disposition: A | Discharge status: Home / Self Care | Payer: 59 | Source: Ambulatory Visit | Attending: Oncology | Admitting: Oncology

## 2021-10-22 DIAGNOSIS — R222 Localized swelling, mass and lump, trunk: Secondary | ICD-10-CM | POA: Diagnosis not present

## 2021-10-22 MED ORDER — GADOBUTROL 1 MMOL/ML IV SOLN
6.0000 mL | Freq: Once | INTRAVENOUS | Status: AC | PRN
  Administered 2021-10-22: 6 mL via INTRAVENOUS

## 2021-10-27 ENCOUNTER — Encounter (INDEPENDENT_AMBULATORY_CARE_PROVIDER_SITE_OTHER): Payer: Self-pay

## 2021-10-27 ENCOUNTER — Encounter: Payer: Self-pay | Admitting: Oncology

## 2021-10-27 ENCOUNTER — Inpatient Hospital Stay (HOSPITAL_BASED_OUTPATIENT_CLINIC_OR_DEPARTMENT_OTHER): Payer: 59 | Admitting: Oncology

## 2021-10-27 ENCOUNTER — Other Ambulatory Visit: Payer: Self-pay

## 2021-10-27 VITALS — BP 109/67 | HR 83 | Temp 97.0°F | Wt 132.0 lb

## 2021-10-27 DIAGNOSIS — R222 Localized swelling, mass and lump, trunk: Secondary | ICD-10-CM

## 2021-11-03 ENCOUNTER — Other Ambulatory Visit: Payer: Self-pay

## 2021-11-03 ENCOUNTER — Inpatient Hospital Stay: Payer: 59 | Attending: Oncology | Admitting: Hospice and Palliative Medicine

## 2021-11-03 DIAGNOSIS — R222 Localized swelling, mass and lump, trunk: Secondary | ICD-10-CM

## 2021-11-03 NOTE — Progress Notes (Signed)
Multidisciplinary Oncology Council Documentation  Dana Stephens was presented by our [facility] on 11/03/2021, which included representatives from:  Palliative Care Dietitian  Physical/Occupational Therapist Nurse Navigator Genetics Speech Therapist Social work Survivorship RN Financial Navigator Research RN   Dana Stephens currently presents with history of nerve sheath tumor  We reviewed previous medical and familial history, history of present illness, and recent lab results along with all available histopathologic and imaging studies. The MOC considered available treatment options and made the following recommendations/referrals:  N/A  The MOC is a meeting of clinicians from various specialty areas who evaluate and discuss patients for whom a multidisciplinary approach is being considered. Final determinations in the plan of care are those of the provider(s).   Today's extended care, comprehensive team conference, Dana Stephens was not present for the discussion and was not examined.

## 2021-11-18 ENCOUNTER — Other Ambulatory Visit: Payer: Self-pay

## 2021-11-18 ENCOUNTER — Ambulatory Visit (LOCAL_COMMUNITY_HEALTH_CENTER): Payer: Self-pay

## 2021-11-18 VITALS — BP 113/76 | Ht 61.0 in | Wt 132.5 lb

## 2021-11-18 DIAGNOSIS — Z3201 Encounter for pregnancy test, result positive: Secondary | ICD-10-CM

## 2021-11-18 LAB — PREGNANCY, URINE: Preg Test, Ur: POSITIVE — AB

## 2021-11-18 MED ORDER — PRENATAL 27-0.8 MG PO TABS: 1.0000 | ORAL_TABLET | Freq: Every day | ORAL | 0 refills | Status: AC

## 2021-11-18 NOTE — Progress Notes (Signed)
UPT positive. Plans prenatal care at [facility]. To clerk for preadmit. [place] interpreter. Josie Saunders, RN

## 2021-11-24 ENCOUNTER — Telehealth: Payer: Self-pay | Admitting: Family Medicine

## 2021-11-24 NOTE — Telephone Encounter (Signed)
Pt did pr-admission paperwork on 11/18/21 pt will be 11 weeks march 23 and has questions regarding her job and pregnancy.

## 2021-11-25 NOTE — Telephone Encounter (Signed)
Return call to patient from yesterday.  LMTC.  Language Line used today.  Estefani / [phone]. Hart Carwin, RN

## 2021-11-25 NOTE — Telephone Encounter (Signed)
Patient called [place] regarding questions of her pregnancy and current job. 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 She states mouth cover masks are recommended but not required. She is worried the fumes are dangerous to her baby while being pregnant.  She states her employer has accommodated her and even though her accomodation was placed she is still able to smell fumes. She states she would rather be out of work for the duration of her pregnancy and her employer are willing to work with her offering her disability "to keep her job placement". Currently she is around [redacted] weeks pregnant, she is not a patient of ours yet until her initial visit at 11 weeks which will be around March 23rd. Suggested to patient if she wishes to be out of work she may that it is up to her but until her appointment she will have to use possible vacation hours or unpaid leave. Informed patient that on her new OB visit to bring disability application, a letter from her stating stating they attempted to accommodate her and recommend her to be on disability, and along with the list of chemicals that she is expose to at work that her employee can provide as we need to see what fumes exactly is she working with.   Provider, A. Streilein made aware of conversion and agrees with plan of care. Patient agrees with plan of care and aware to bring above information to her new OB visit.   Floy Sabina, RN   Floy Sabina, RN

## 2021-11-30 ENCOUNTER — Telehealth: Payer: Self-pay

## 2021-11-30 NOTE — Telephone Encounter (Signed)
Call to patient stating we have received her disability forms and to scheduled for initial new OB appointment. Appointment scheduled for 12/21/20 at 24 for initial OB. Patient aware to bring name of chemicals/fumes she is expose to at work for her visit.   Adalberto Cole, RN

## 2021-12-21 ENCOUNTER — Encounter: Payer: Self-pay | Admitting: Family Medicine

## 2021-12-21 ENCOUNTER — Other Ambulatory Visit: Payer: Self-pay

## 2021-12-21 ENCOUNTER — Ambulatory Visit: Payer: 59 | Admitting: Family Medicine

## 2021-12-21 DIAGNOSIS — Z23 Encounter for immunization: Secondary | ICD-10-CM | POA: Diagnosis not present

## 2021-12-21 DIAGNOSIS — Z3480 Encounter for supervision of other normal pregnancy, unspecified trimester: Secondary | ICD-10-CM | POA: Insufficient documentation

## 2021-12-21 DIAGNOSIS — O34219 Maternal care for unspecified type scar from previous cesarean delivery: Secondary | ICD-10-CM

## 2021-12-21 DIAGNOSIS — Z3481 Encounter for supervision of other normal pregnancy, first trimester: Secondary | ICD-10-CM | POA: Diagnosis not present

## 2021-12-21 DIAGNOSIS — Z8759 Personal history of other complications of pregnancy, childbirth and the puerperium: Secondary | ICD-10-CM

## 2021-12-21 LAB — URINALYSIS
Bilirubin, UA: NEGATIVE
Glucose, UA: NEGATIVE
Ketones, UA: NEGATIVE
Leukocytes,UA: NEGATIVE
Nitrite, UA: NEGATIVE
Protein,UA: NEGATIVE
Specific Gravity, UA: 1.025 (ref 1.005–1.030)
Urobilinogen, Ur: 0.2 mg/dL (ref 0.2–1.0)
pH, UA: 6 (ref 5.0–7.5)

## 2021-12-21 LAB — WET PREP FOR TRICH, YEAST, CLUE
Trichomonas Exam: NEGATIVE
Yeast Exam: NEGATIVE

## 2021-12-21 LAB — HEMOGLOBIN, FINGERSTICK: Hemoglobin: 12.2 g/dL (ref 11.1–15.9)

## 2021-12-21 NOTE — Progress Notes (Signed)
[facility]  ?Maternal Health Clinic ? ? ?INITIAL PRENATAL VISIT NOTE ? ?Subjective:  ?Dana Stephens is a 31 y.o. [phone] at [redacted]w[redacted]d being seen today to start prenatal care at the [facility] Department.  She is currently monitored for the following issues for this high-risk pregnancy and has History of cesarean delivery, currently pregnant; Latent tuberculosis; History of pre-eclampsia; Paravertebral mass; and Supervision of other normal pregnancy, antepartum on their problem list. ? ?Patient reports no complaints.  Contractions: Not present. Vag. Bleeding: None.  Movement: Present. Denies leaking of fluid.  ? ?Indications for ASA therapy (per uptodate) ?One of the following: ?Previous pregnancy with preeclampsia, especially early onset and with an adverse outcome Yes ?Multifetal gestation No ?Chronic hypertension No ?Type 1 or 2 diabetes mellitus No ?Chronic kidney disease No ?Autoimmune disease (antiphospholipid syndrome, systemic lupus erythematosus) No ? ?Two or more of the following: ?Nulliparity No ?Obesity (body mass index >30 kg/m2) No ?Family history of preeclampsia in mother or sister No ?Age ?35 years No ?Sociodemographic characteristics (African American race, low socioeconomic level) No ?Personal risk factors (eg, previous pregnancy with low birth weight or small for gestational age infant, previous adverse pregnancy outcome [eg, stillbirth], interval >10 years between pregnancies) No ? ? ?The following portions of the patient's history were reviewed and updated as appropriate: allergies, current medications, past family history, past medical history, past social history, past surgical history and problem list. Problem list updated. ? ?Objective:  ?There were no vitals filed for this visit. ? ?Fetal Status: Fetal Heart Rate (bpm): 160 Fundal Height: 11 cm Movement: Present  Presentation: Undeterminable ? ?Physical Exam ?Vitals and nursing note reviewed.   ?Constitutional:   ?   General: She is not in acute distress. ?   Appearance: Normal appearance. She is well-developed.  ?HENT:  ?   Head: Normocephalic and atraumatic.  ?   Right Ear: External ear normal.  ?   Left Ear: External ear normal.  ?   Nose: Nose normal. No congestion or rhinorrhea.  ?   Mouth/Throat:  ?   Lips: Pink.  ?   Mouth: Mucous membranes are moist.  ?   Dentition: Normal dentition. No dental caries.  ?   Pharynx: Oropharynx is clear. Uvula midline.  ?   Comments: Dentition: teeth missing  ?Eyes:  ?   General: No scleral icterus. ?   Conjunctiva/sclera: Conjunctivae normal.  ?Neck:  ?   Thyroid: No thyroid mass or thyromegaly.  ?Cardiovascular:  ?   Rate and Rhythm: Normal rate and regular rhythm.  ?   Pulses: Normal pulses.  ?   Heart sounds: Normal heart sounds.  ?   Comments: Extremities are warm and well perfused ?Pulmonary:  ?   Effort: Pulmonary effort is normal.  ?   Breath sounds: Normal breath sounds.  ?Chest:  ?   Chest wall: No mass.  ?Breasts: ?   Tanner Score is 5.  ?   Breasts are symmetrical.  ?   Right: Normal. No mass, nipple discharge or skin change.  ?   Left: Normal. No mass, nipple discharge or skin change.  ?Abdominal:  ?   General: Abdomen is flat.  ?   Palpations: Abdomen is soft.  ?   Tenderness: There is no abdominal tenderness.  ?   Comments: Gravid   ?Genitourinary: ?   General: Normal vulva.  ?   Exam position: Lithotomy position.  ?   Pubic Area: No rash.   ?  Labia:     ?   Right: No rash.     ?   Left: No rash.   ?   Vagina: Normal. No vaginal discharge.  ?   Cervix: No cervical motion tenderness or friability.  ?   Uterus: Normal. Enlarged (Gravid 11 size). Not tender.   ?   Adnexa: Right adnexa normal and left adnexa normal.  ?   Rectum: Normal. No external hemorrhoid.  ?   Comments: External genitalia without, lice, nits, erythema, edema , lesions or inguinal adenopathy. Vagina with normal mucosa and white discharge and pH equals 4.  Cervix without visual  lesions, uterus firm, mobile, non-tender, no masses, CMT adnexal fullness or tenderness.   ?Musculoskeletal:  ?   Cervical back: Normal range of motion and neck supple.  ?   Right lower leg: No edema.  ?   Left lower leg: No edema.  ?Lymphadenopathy:  ?   Cervical: No cervical adenopathy.  ?   Upper Body:  ?   Right upper body: No axillary adenopathy.  ?   Left upper body: No axillary adenopathy.  ?Skin: ?   General: Skin is warm.  ?   Capillary Refill: Capillary refill takes less than 2 seconds.  ?Neurological:  ?   Mental Status: She is alert and oriented to person, place, and time.  ?Psychiatric:     ?   Mood and Affect: Mood normal.     ?   Behavior: Behavior normal.  ? ? ?Assessment and Plan:  ?Pregnancy: [id number] at [redacted]w[redacted]d ? ?1. Supervision of other normal pregnancy, antepartum ? ?- Dating: has sure LMP of 10/05/2021, dating [place] not indicated will refer for anatomy US  ?- Genetic screening: declines, will follow up about QUAD screen  ?- Pregnancy sx: some nausea, inforation sheet given on preventive measures or treatment  ?- No Up to date on dental care. Reviewed safety of routine care in pregnancy  ?- Has support system that is involve, no longer working  ?- Routine labs today, pap 09/16/20 ?- Vaccinations: desires flu but declines Covid vaccines  ?- Has two minor risk factors for preeclampsia. Recommended ASA 81mg  daily for preeclampsia prevention. Discussed starting at 11-13 weeks and continuing through pregnancy. We will touch base at next appointment to see if she started this medication.  ?  ?- Prenatal profile without Varicella/Rubella [id number]) ?- Chlamydia/GC NAA, Confirmation ?- Urine Culture ?- HCV Ab w Reflex to Quant PCR ?- HIV-1/HIV-2 Qualitative RNA ?- Hemoglobin, venipuncture ?- Urinalysis (Urine Dip) ?- WET PREP FOR [place], YEAST, CLUE ?- [id number] 7+Oxycodone-Bund ? ?2. History of pre-eclampsia ?ASA therapy discussed- information sheet given  ? ?3. History of cesarean delivery, currently pregnant ?1st  pregnancy, vaginal delivery with second, desires TOLAC  ? ? ? ?Discussed overview of care and coordination with inpatient delivery practices including [facility], [facility], [facility] and [facility].  ? ? ?Preterm labor symptoms and general obstetric precautions including but not limited to vaginal bleeding, contractions, leaking of fluid and fetal movement were reviewed in detail with the patient. ? ?Please refer to After Visit Summary for other counseling recommendations.  ? ?Return in 4 weeks (on 01/18/2022) for [facility] RV. ? ?Future Appointments  ?Date Time Provider [place]  ?01/18/2022 10:00 AM [facility] PROVIDER [facility] None  ? ?[facility] agency interpreter used for Spanish interpretation.     ? ? ?Junious Dresser, FNP ? ?

## 2021-12-21 NOTE — Progress Notes (Signed)
Presents for initiation of prenatal care and denies any prior medical evaluations this pregnancy. Taking PNV QD. 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 Denies any international travel since that time. Per client, has voicemail set up on her cell phone. Jossie Ng, RN ?Wet prep, hgb and urine dip reviewed. No interventions required per standing orders. Client tolerated flu vaccine without complaint. Counseled on flu vaccine recommendations for those 37 months of age and older. Jossie Ng, RN ? ? ? ?

## 2021-12-22 LAB — CBC/D/PLT+RPR+RH+ABO+AB SCR
Antibody Screen: NEGATIVE
Basophils Absolute: 0 10*3/uL (ref 0.0–0.2)
Basos: 0 %
EOS (ABSOLUTE): 0 10*3/uL (ref 0.0–0.4)
Eos: 0 %
Hematocrit: 35.6 % (ref 34.0–46.6)
Hemoglobin: 12.1 g/dL (ref 11.1–15.9)
Hepatitis B Surface Ag: NEGATIVE
Immature Grans (Abs): 0.1 10*3/uL (ref 0.0–0.1)
Immature Granulocytes: 1 %
Lymphocytes Absolute: 1.7 10*3/uL (ref 0.7–3.1)
Lymphs: 14 %
MCH: 30.9 pg (ref 26.6–33.0)
MCHC: 34 g/dL (ref 31.5–35.7)
MCV: 91 fL (ref 79–97)
Monocytes Absolute: 0.7 10*3/uL (ref 0.1–0.9)
Monocytes: 6 %
Neutrophils Absolute: 9.2 10*3/uL — ABNORMAL HIGH (ref 1.4–7.0)
Neutrophils: 79 %
Platelets: 276 10*3/uL (ref 150–450)
RBC: 3.91 x10E6/uL (ref 3.77–5.28)
RDW: 11.9 % (ref 11.7–15.4)
RPR Ser Ql: NONREACTIVE
Rh Factor: POSITIVE
WBC: 11.6 10*3/uL — ABNORMAL HIGH (ref 3.4–10.8)

## 2021-12-22 LAB — HIV-1/HIV-2 QUALITATIVE RNA
HIV-1 RNA, Qualitative: NONREACTIVE
HIV-2 RNA, Qualitative: NONREACTIVE

## 2021-12-22 LAB — HCV AB W REFLEX TO QUANT PCR: HCV Ab: NONREACTIVE

## 2021-12-22 LAB — HCV INTERPRETATION

## 2021-12-23 LAB — 789231 7+OXYCODONE-BUND
Amphetamines, Urine: NEGATIVE ng/mL
BENZODIAZ UR QL: NEGATIVE ng/mL
Barbiturate screen, urine: NEGATIVE ng/mL
Cannabinoid Quant, Ur: NEGATIVE ng/mL
Cocaine (Metab.): NEGATIVE ng/mL
OPIATE SCREEN URINE: NEGATIVE ng/mL
Oxycodone/Oxymorphone, Urine: NEGATIVE ng/mL
PCP Quant, Ur: NEGATIVE ng/mL

## 2021-12-23 LAB — CHLAMYDIA/GC NAA, CONFIRMATION
Chlamydia trachomatis, NAA: NEGATIVE
Neisseria gonorrhoeae, NAA: NEGATIVE

## 2021-12-23 LAB — URINE CULTURE: Organism ID, Bacteria: NO GROWTH

## 2022-01-08 ENCOUNTER — Emergency Department: Payer: 59

## 2022-01-08 ENCOUNTER — Emergency Department
Admission: EM | Admit: 2022-01-08 | Discharge: 2022-01-08 | Disposition: A | Discharge status: Home / Self Care | Payer: 59 | Attending: Emergency Medicine | Admitting: Emergency Medicine

## 2022-01-08 DIAGNOSIS — O209 Hemorrhage in early pregnancy, unspecified: Secondary | ICD-10-CM | POA: Diagnosis present

## 2022-01-08 DIAGNOSIS — Z3A13 13 weeks gestation of pregnancy: Secondary | ICD-10-CM | POA: Insufficient documentation

## 2022-01-08 DIAGNOSIS — O200 Threatened abortion: Secondary | ICD-10-CM | POA: Diagnosis not present

## 2022-01-08 LAB — BASIC METABOLIC PANEL
Anion gap: 8 (ref 5–15)
BUN: 10 mg/dL (ref 6–20)
CO2: 22 mmol/L (ref 22–32)
Calcium: 9.2 mg/dL (ref 8.9–10.3)
Chloride: 103 mmol/L (ref 98–111)
Creatinine, Ser: 0.42 mg/dL — ABNORMAL LOW (ref 0.44–1.00)
GFR, Estimated: >60 mL/min (ref >60)
Glucose, Bld: 97 mg/dL (ref 70–99)
Potassium: 3.8 mmol/L (ref 3.5–5.1)
Sodium: 133 mmol/L — ABNORMAL LOW (ref 135–145)

## 2022-01-08 LAB — CBC
HCT: 35 % — ABNORMAL LOW (ref 36.0–46.0)
Hemoglobin: 11.4 g/dL — ABNORMAL LOW (ref 12.0–15.0)
MCH: 30.2 pg (ref 26.0–34.0)
MCHC: 32.6 g/dL (ref 30.0–36.0)
MCV: 92.6 fL (ref 80.0–100.0)
Platelets: 224 10*3/uL (ref 150–400)
RBC: 3.78 MIL/uL — ABNORMAL LOW (ref 3.87–5.11)
RDW: 12.5 % (ref 11.5–15.5)
WBC: 10 10*3/uL (ref 4.0–10.5)
nRBC: 0 % (ref 0.0–0.2)

## 2022-01-08 LAB — URINALYSIS, COMPLETE (UACMP) WITH MICROSCOPIC
Bilirubin Urine: NEGATIVE
Glucose, UA: NEGATIVE mg/dL
Ketones, ur: NEGATIVE mg/dL
Leukocytes,Ua: NEGATIVE
Nitrite: NEGATIVE
Protein, ur: NEGATIVE mg/dL
Specific Gravity, Urine: 1.012 (ref 1.005–1.030)
pH: 7 (ref 5.0–8.0)

## 2022-01-08 LAB — ABO/RH: ABO/RH(D): O POS

## 2022-01-08 LAB — HCG, QUANTITATIVE, PREGNANCY: hCG, Beta Chain, Quant, S: 61074 m[IU]/mL — ABNORMAL HIGH (ref <5)

## 2022-01-08 NOTE — ED Triage Notes (Signed)
Pt noticed vaginal bleeding this morning when she got up. Has had one pad on since then and hasn't had to change it. States it looks like watery blood. No cramping.  ?

## 2022-01-08 NOTE — ED Provider Notes (Signed)
? ?[facility] ?Provider Note ? ? ? Event Date/Time  ? First MD Initiated Contact with Patient 01/08/22 1050   ?  (approximate) ? ? ?History  ? ?Vaginal Bleeding ? ? ?HPI ? ?Dana Stephens is a 31 y.o. female  G4P2 with past medical history of preeclampsia, prior spontaneous abortion latent TB that has been treated presents with vaginal bleeding.  LMP was first week of January he is about [redacted] weeks pregnant.  Started having bleeding today.  Denies associated abdominal cramping.  Describes bright red blood that was also somewhat watery came out and a gush.  Has been wearing a pad since still feels like she is having some bleeding.  Denies passage of clots.  Denies urinary symptoms abnormal discharge vomiting or fever.  She is following at the health department for this pregnancy.  She is taking prenatal vitamins. ? ?  ? ?Past Medical History:  ?Diagnosis Date  ? Acute gastritis without hemorrhage, unspecified gastritis type   ? History of pre-eclampsia   ? History of spontaneous abortion   ? Paravertebral mass   ? 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 TB lung, latent   ? History of latent TB. Completed acceptable treatment regimen in 2011 with 9 months of Isoniazid.  ? ? ?Patient Active Problem List  ? Diagnosis Date Noted  ? Supervision of other normal pregnancy, antepartum 12/21/2021  ? Paravertebral mass 10/10/2021  ? History of cesarean delivery, currently pregnant 06/26/2016  ? Latent tuberculosis 06/26/2016  ? History of pre-eclampsia 06/26/2016  ? ? ? ?Physical Exam  ?Triage Vital Signs: ?ED Triage Vitals [01/08/22 0954]  ?Enc Vitals Group  ?   BP 106/72  ?   Pulse Rate 98  ?   Resp 18  ?   Temp 98 ?F (36.7 ?C)  ?   Temp Source Oral  ?   SpO2 97 %  ?   Weight 135 lb (61.2 kg)  ?   Height   ?   Head Circumference   ?   Peak Flow   ?   Pain Score 0  ?   Pain Loc   ?   Pain Edu?   ?   Excl. in GC?   ? ? ?Most recent vital signs: ?Vitals:  ? 01/08/22 0954 01/08/22 1348  ?BP: 106/72  106/65  ?Pulse: 98 87  ?Resp: 18 16  ?Temp: 98 ?F (36.7 ?C) 98.4 ?F (36.9 ?C)  ?SpO2: 97% 100%  ? ? ? ?General: Awake, no distress.  ?CV:  Good peripheral perfusion.  ?Resp:  Normal effort.  ?Abd:  No distention.  Palpable uterus, abdomen is soft and nontender ?Neuro:             Awake, Alert, Oriented x 3  ?Other:   ? ? ?ED Results / Procedures / Treatments  ?Labs ?(all labs ordered are listed, but only abnormal results are displayed) ?Labs Reviewed  ?CBC - Abnormal; Notable for the following components:  ?    Result Value  ? RBC 3.78 (*)   ? Hemoglobin 11.4 (*)   ? HCT 35.0 (*)   ? All other components within normal limits  ?BASIC METABOLIC PANEL - Abnormal; Notable for the following components:  ? Sodium 133 (*)   ? Creatinine, Ser 0.42 (*)   ? All other components within normal limits  ?HCG, QUANTITATIVE, PREGNANCY - Abnormal; Notable for the following components:  ? hCG, Beta Chain, Quant, S 61,074 (*)   ? All  other components within normal limits  ?URINALYSIS, COMPLETE (UACMP) WITH MICROSCOPIC - Abnormal; Notable for the following components:  ? Color, Urine YELLOW (*)   ? APPearance HAZY (*)   ? Hgb urine dipstick MODERATE (*)   ? Bacteria, UA RARE (*)   ? All other components within normal limits  ?ABO/RH  ? ? ? ?EKG ? ? ? ? ?RADIOLOGY ? ?First trimester ultrasound reviewed by myself shows IUP with good fetal heart rate ? ?PROCEDURES: ? ?Critical Care performed: No ? ?Procedures ? ? ?MEDICATIONS ORDERED IN ED: ?Medications - No data to display ? ? ?IMPRESSION / MDM / ASSESSMENT AND PLAN / ED COURSE  ?I reviewed the triage vital signs and the nursing notes. ?             ?               ? ?Differential diagnosis includes, but is not limited to, threatened miscarriage, subchorionic hemorrhage, hematuria ? ?Patient is a 31 year old female is approximately [redacted] weeks pregnant presents with an episode of vaginal bleeding that started this morning.  There is no associated fevers chills urinary symptoms or abdominal  cramping or pain.  Vital signs within normal limits.  Beta-hCG 61,000.  Hemoglobin is 11.4.  Patient appears well abdomen is soft and nontender.  She has not yet had a official first trimester ultrasound will obtain radiology ultrasound. ? ?Ultrasound of the pelvic organs shows a IUP approximately 13 weeks and 6 days with a normal fetal heart rate no subchorionic hemorrhage.  Discussed results with mom.  Discussed threatened miscarriage and following up with the health department as well as her returning for heavy bleeding which I explained to her is bleeding through a pad more frequently than once every 2 hours. ? ? ?FINAL CLINICAL IMPRESSION(S) / ED DIAGNOSES  ? ?Final diagnoses:  ?Threatened miscarriage  ? ? ? ?Rx / DC Orders  ? ?ED Discharge Orders   ? ? None  ? ?  ? ? ? ?Note:  This document was prepared using Dragon voice recognition software and may include unintentional dictation errors. ?  ?Georga Hacking, MD ?01/08/22 1529 ? ?

## 2022-01-08 NOTE — ED Notes (Signed)
Patient reports urinating blood clots.RN made aware. ?

## 2022-01-08 NOTE — ED Notes (Signed)
31 yom with a c/c of vaginal bleeding since this morning. The pt advised she is [redacted] weeks pregnant.  ?

## 2022-01-08 NOTE — Discharge Instructions (Addendum)
Your ultrasound today showed a normal intrauterine pregnancy.  You may continue to have some bleeding.  This does put you at high risk for miscarriage.  If you are soaking through a pad more frequently than once every 2 hours, please return to the emergency department.  Please follow-up at the health department. ?

## 2022-01-12 NOTE — Addendum Note (Signed)
Addended by: Cletis Media on: 01/12/2022 10:38 AM ? ? Modules accepted: Orders ? ?

## 2022-01-18 ENCOUNTER — Ambulatory Visit: Payer: 59 | Admitting: Advanced Practice Midwife

## 2022-01-18 ENCOUNTER — Ambulatory Visit: Payer: 59

## 2022-01-18 DIAGNOSIS — O34219 Maternal care for unspecified type scar from previous cesarean delivery: Secondary | ICD-10-CM

## 2022-01-18 DIAGNOSIS — Z3482 Encounter for supervision of other normal pregnancy, second trimester: Secondary | ICD-10-CM

## 2022-01-18 DIAGNOSIS — Z3480 Encounter for supervision of other normal pregnancy, unspecified trimester: Secondary | ICD-10-CM

## 2022-01-18 NOTE — Progress Notes (Signed)
[facility] Department ?Maternal Health Clinic ? ?PRENATAL VISIT NOTE ? ?Subjective:  ?Dana Stephens is a 31 y.o. [phone] at [redacted]w[redacted]d being seen today for ongoing prenatal care.  She is currently monitored for the following issues for this low-risk pregnancy and has History of cesarean delivery, currently pregnant 04/20/2008 followed by successful VBAC; Latent tuberculosis; History of pre-eclampsia; Paravertebral mass; and Supervision of other normal pregnancy, antepartum on their problem list. ? ?Patient reports  light black spotting when sneezes or coughs .  Contractions: Not present. Vag. Bleeding: Bloody Show.  Movement: Present. Denies leaking of fluid/ROM.  ? ?The following portions of the patient's history were reviewed and updated as appropriate: allergies, current medications, past family history, past medical history, past social history, past surgical history and problem list. Problem list updated. ? ?Objective:  ? ?Vitals:  ? 01/18/22 1336  ?BP: 118/75  ?Pulse: (!) 116  ?Temp: 97.9 ?F (36.6 ?C)  ?Weight: 140 lb 6.4 oz (63.7 kg)  ? ? ?Fetal Status: Fetal Heart Rate (bpm): 140 Fundal Height: 17 cm Movement: Present    ? ?General:  Alert, oriented and cooperative. Patient is in no acute distress.  ?Skin: Skin is warm and dry. No rash noted.   ?Cardiovascular: Normal heart rate noted  ?Respiratory: Normal respiratory effort, no problems with respiration noted  ?Abdomen: Soft, gravid, appropriate for gestational age.  Pain/Pressure: Absent     ?Pelvic: Cervical exam deferred        ?Extremities: Normal range of motion.  Edema: None  ?Mental Status: Normal mood and affect. Normal behavior. Normal judgment and thought content.  ? ?Assessment and Plan:  ?Pregnancy: [id number] at [redacted]w[redacted]d ? ?1. Supervision of other normal pregnancy, antepartum ?5 lb 6.4 oz (2.449 kg) ?Not exercising--suggestions given ?Nor working ?Wants Quad screen today ?Went to [facility] ER 01/08/22 for vaginal bleeding which stopped 2 days  later; u/s done that day at 13 6/7; no previa, no subchorionic hemmorhage ?Pap 09/16/20 neg HPV neg ?- AFP TETRA ? ?2. History of cesarean delivery, currently pregnant 04/20/2008 followed by successful VBAC ?Wants TOLAC ?Needs [place] delivery plans apt ? ?- AFP TETRA ? ? ?Preterm labor symptoms and general obstetric precautions including but not limited to vaginal bleeding, contractions, leaking of fluid and fetal movement were reviewed in detail with the patient. ?Please refer to After Visit Summary for other counseling recommendations.  ?Return in about 4 weeks (around 02/15/2022) for routine PNC. ? ?No future appointments. ? ?Alberteen Spindle, CNM ? ?

## 2022-01-18 NOTE — Progress Notes (Addendum)
Patient here for MH RV at about 15 weeks. ED visit on 01/08/22 for vaginal bleeding. She states she bled heavily for 2 days and is still spotting. U/S from 01/08/22 in outguide. Patient desires quad screen today.Marland KitchenMarland KitchenMarland KitchenBurt Knack, RN  ?

## 2022-01-20 LAB — AFP TETRA
DIA Mom Value: 0.97
DIA Value (EIA): 162.76 pg/mL
DSR (By Age)    1 IN: 389
DSR (Second Trimester) 1 IN: 1800
Gestational Age: 15.2 WEEKS
MSAFP Mom: 1.05
MSAFP: 33.2 ng/mL
MSHCG Mom: 0.88
MSHCG: 49172 m[IU]/mL
Maternal Age At EDD: 33.7 yr
Osb Risk: 10000
T18 (By Age): 1:1517 {titer}
Test Results:: NEGATIVE
Weight: 146 [lb_av]
uE3 Mom: 0.78
uE3 Value: 0.62 ng/mL

## 2022-02-15 ENCOUNTER — Ambulatory Visit: Payer: 59 | Admitting: Family Medicine

## 2022-02-15 VITALS — BP 99/66 | HR 101 | Temp 98.6°F | Wt 143.6 lb

## 2022-02-15 DIAGNOSIS — Z8759 Personal history of other complications of pregnancy, childbirth and the puerperium: Secondary | ICD-10-CM

## 2022-02-15 DIAGNOSIS — Z3480 Encounter for supervision of other normal pregnancy, unspecified trimester: Secondary | ICD-10-CM

## 2022-02-15 MED ORDER — PRENATAL VITAMINS 28-0.8 MG PO TABS: 1.0000 | ORAL_TABLET | Freq: Every day | ORAL | 0 refills | Status: DC

## 2022-02-15 NOTE — Progress Notes (Signed)
[facility] Department ?Maternal Health Clinic ? ?PRENATAL VISIT NOTE ? ?Subjective:  ?Dana Stephens is a 31 y.o. [phone] at [redacted]w[redacted]d being seen today for ongoing prenatal care.  She is currently monitored for the following issues for this low-risk pregnancy and has History of cesarean delivery, currently pregnant 04/20/2008 followed by successful VBAC; Latent tuberculosis; History of pre-eclampsia; Paravertebral mass; and Supervision of other normal pregnancy, antepartum on their problem list. ? ?Patient reports no complaints.  Contractions: Not present. Vag. Bleeding: None.  Movement: Present. Denies leaking of fluid/ROM.  ? ?The following portions of the patient's history were reviewed and updated as appropriate: allergies, current medications, past family history, past medical history, past social history, past surgical history and problem list. Problem list updated. ? ?Objective:  ? ?Vitals:  ? 02/15/22 0955  ?BP: 99/66  ?Pulse: (!) 101  ?Temp: 98.6 ?F (37 ?C)  ?Weight: 143 lb 9.6 oz (65.1 kg)  ? ? ?Fetal Status: Fetal Heart Rate (bpm): 150 Fundal Height: 19 cm Movement: Present    ? ?General:  Alert, oriented and cooperative. Patient is in no acute distress.  ?Skin: Skin is warm and dry. No rash noted.   ?Cardiovascular: Normal heart rate noted  ?Respiratory: Normal respiratory effort, no problems with respiration noted  ?Abdomen: Soft, gravid, appropriate for gestational age.  Pain/Pressure: Absent     ?Pelvic: Cervical exam deferred        ?Extremities: Normal range of motion.  Edema: None  ?Mental Status: Normal mood and affect. Normal behavior. Normal judgment and thought content.  ? ?Assessment and Plan:  ?Pregnancy: [id number] at [redacted]w[redacted]d ? ?1. Supervision of other normal pregnancy, antepartum ?TWG 8 lbs  ?Taking PNV as directed  ?Walking 3-4 times per week for ~1 hr.   ?Including protein in all meals  ?Referral for Anatomy scan sent.  Pt will be called with appointment time.  ? ? ?2. History of  pre-eclampsia ?Taking ASA as directed  ? ? ?Preterm labor symptoms and general obstetric precautions including but not limited to vaginal bleeding, contractions, leaking of fluid and fetal movement were reviewed in detail with the patient. ?Please refer to After Visit Summary for other counseling recommendations.  ?No follow-ups on file. ? ?Future Appointments  ?Date Time Provider [place]  ?03/15/2022 10:20 AM [facility] PROVIDER AC-[facility] None  ? ? ?Junious Dresser, FNP ? ?

## 2022-02-15 NOTE — Addendum Note (Signed)
Addended by: Tawny Hopping A on: 02/15/2022 05:23 PM ? ? Modules accepted: Orders ? ?

## 2022-02-15 NOTE — Progress Notes (Addendum)
Here today for 19.0 week MH RV. Taking PNV QD. Denies ED/hospital visits since last RV. Needs Anatomy scan. Tawny Hopping, RN ? ?

## 2022-03-04 ENCOUNTER — Telehealth: Payer: Self-pay | Admitting: Family Medicine

## 2022-03-04 NOTE — Telephone Encounter (Signed)
Return call to patient this morning regarding her U/S appointment on 03/10/2022 at 9:15 am at [facility].  The address was provided to patient.  Language Line used today.  Debarah Crape / [phone].  Hart Carwin, RN

## 2022-03-04 NOTE — Telephone Encounter (Signed)
Pt received a call about the date and time of her ultrasound appt but the message didn't say where she was supposed to go so she's callling to find out where her appt is scheduled.

## 2022-03-15 ENCOUNTER — Ambulatory Visit: Payer: 59 | Admitting: Advanced Practice Midwife

## 2022-03-15 VITALS — BP 109/67 | HR 112 | Temp 97.0°F | Wt 149.2 lb

## 2022-03-15 DIAGNOSIS — Z8759 Personal history of other complications of pregnancy, childbirth and the puerperium: Secondary | ICD-10-CM

## 2022-03-15 DIAGNOSIS — Z3480 Encounter for supervision of other normal pregnancy, unspecified trimester: Secondary | ICD-10-CM

## 2022-03-15 DIAGNOSIS — O34219 Maternal care for unspecified type scar from previous cesarean delivery: Secondary | ICD-10-CM

## 2022-03-15 DIAGNOSIS — Z3482 Encounter for supervision of other normal pregnancy, second trimester: Secondary | ICD-10-CM

## 2022-03-15 NOTE — Progress Notes (Signed)
[facility]  PRENATAL VISIT NOTE  Subjective:  Dana Stephens is a 31 y.o. [phone] at [redacted]w[redacted]d being seen today for ongoing prenatal care.  She is currently monitored for the following issues for this low-risk pregnancy and has History of cesarean delivery, currently pregnant 04/20/2008 followed by successful VBAC; Latent tuberculosis; History of pre-eclampsia; Paravertebral mass; and Supervision of other normal pregnancy, antepartum on their problem list.  Patient reports no complaints.  Contractions: Not present. Vag. Bleeding: None.  Movement: Present. Denies leaking of fluid/ROM.   The following portions of the patient's history were reviewed and updated as appropriate: allergies, current medications, past family history, past medical history, past social history, past surgical history and problem list. Problem list updated.  Objective:   Vitals:   03/15/22 1016  BP: 109/67  Pulse: (!) 112  Temp: (!) 97 F (36.1 C)  Weight: 149 lb 3.2 oz (67.7 kg)    Fetal Status: Fetal Heart Rate (bpm): 150 Fundal Height: 23 cm Movement: Present     General:  Alert, oriented and cooperative. Patient is in no acute distress.  Skin: Skin is warm and dry. No rash noted.   Cardiovascular: Normal heart rate noted  Respiratory: Normal respiratory effort, no problems with respiration noted  Abdomen: Soft, gravid, appropriate for gestational age.  Pain/Pressure: Absent     Pelvic: Cervical exam deferred        Extremities: Normal range of motion.  Edema: None  Mental Status: Normal mood and affect. Normal behavior. Normal judgment and thought content.   Assessment and Plan:  Pregnancy: [id number] at [redacted]w[redacted]d  1. Supervision of other normal pregnancy, antepartum 14 lb 3.2 oz (6.441 kg) 6 lb wt gain in past 4 wks Walking 3-4x/wk x 1 1/2 hours at park with her daughter--counseled to walk x 20 min without stopping rather than 1 1/2 hours leisurely with  stopping Not working Reviewed 03/10/22 u/s with posterior placenta, AFI wnl, 3VC, EFW=41%, at 22 2/7  2. History of pre-eclampsia 109/67 today Taking ASA 81 mg daily 3. History of cesarean delivery, currently pregnant 04/20/2008 followed by successful VBAC Wants TOLAC Needs delivery plans apt with [facility]   Preterm labor symptoms and general obstetric precautions including but not limited to vaginal bleeding, contractions, leaking of fluid and fetal movement were reviewed in detail with the patient. Please refer to After Visit Summary for other counseling recommendations.  Return in about 4 weeks (around 04/12/2022) for routine PNC.  No future appointments.  Alberteen Spindle, CNM

## 2022-04-12 ENCOUNTER — Ambulatory Visit: Payer: 59 | Admitting: Advanced Practice Midwife

## 2022-04-12 VITALS — BP 110/70 | HR 99 | Temp 98.8°F | Wt 152.2 lb

## 2022-04-12 DIAGNOSIS — O34219 Maternal care for unspecified type scar from previous cesarean delivery: Secondary | ICD-10-CM

## 2022-04-12 DIAGNOSIS — Z3483 Encounter for supervision of other normal pregnancy, third trimester: Secondary | ICD-10-CM

## 2022-04-12 DIAGNOSIS — Z23 Encounter for immunization: Secondary | ICD-10-CM | POA: Diagnosis not present

## 2022-04-12 DIAGNOSIS — Z3480 Encounter for supervision of other normal pregnancy, unspecified trimester: Secondary | ICD-10-CM

## 2022-04-12 DIAGNOSIS — Z8759 Personal history of other complications of pregnancy, childbirth and the puerperium: Secondary | ICD-10-CM

## 2022-04-12 DIAGNOSIS — Z3482 Encounter for supervision of other normal pregnancy, second trimester: Secondary | ICD-10-CM

## 2022-04-12 DIAGNOSIS — R7309 Other abnormal glucose: Secondary | ICD-10-CM | POA: Insufficient documentation

## 2022-04-12 LAB — WET PREP FOR TRICH, YEAST, CLUE: Trichomonas Exam: NEGATIVE

## 2022-04-12 LAB — HEMOGLOBIN, FINGERSTICK: Hemoglobin: 11.6 g/dL (ref 11.1–15.9)

## 2022-04-12 MED ORDER — CLOTRIMAZOLE 1 % VA CREA: 1.0000 | TOPICAL_CREAM | Freq: Every day | VAGINAL | 0 refills | Status: AC

## 2022-04-12 NOTE — Progress Notes (Signed)
[facility]  PRENATAL VISIT NOTE  Subjective:  Dana Stephens is a 31 y.o. [phone] at [redacted]w[redacted]d being seen today for ongoing prenatal care.  She is currently monitored for the following issues for this low-risk pregnancy and has History of cesarean delivery, currently pregnant 04/20/2008 followed by successful VBAC; Latent tuberculosis; History of pre-eclampsia; Paravertebral mass; and Supervision of other normal pregnancy, antepartum on their problem list.  Patient reports  white fluid leaking with external and internal itching x 3 days .  Contractions: Not present. Vag. Bleeding: None.  Movement: Present. Denies leaking of fluid/ROM.   The following portions of the patient's history were reviewed and updated as appropriate: allergies, current medications, past family history, past medical history, past social history, past surgical history and problem list. Problem list updated.  Objective:   Vitals:   04/12/22 0855  BP: 110/70  Pulse: 99  Temp: 98.8 F (37.1 C)  Weight: 152 lb 3.2 oz (69 kg)    Fetal Status: Fetal Heart Rate (bpm): 140 Fundal Height: 26 cm Movement: Present     General:  Alert, oriented and cooperative. Patient is in no acute distress.  Skin: Skin is warm and dry. No rash noted.   Cardiovascular: Normal heart rate noted  Respiratory: Normal respiratory effort, no problems with respiration noted  Abdomen: Soft, gravid, appropriate for gestational age.  Pain/Pressure: Absent     Pelvic: Cervical exam deferred        Extremities: Normal range of motion.  Edema: None  Mental Status: Normal mood and affect. Normal behavior. Normal judgment and thought content.   Assessment and Plan:  Pregnancy: [id number] at [redacted]w[redacted]d  - Hemoglobin, venipuncture - Glucose, 1 hour - HIV-1/HIV-2 Qualitative RNA - RPR  2. Supervision of other normal pregnancy, antepartum C/o white fluid leaking out of vagina with external and internal  itching x 3 days--erythematous vagina with white curdy leukorrhea, ph<4.5; wet mount done Last sex 1 mo ago.  Not working Walking 2-3x/wk x 20-30 min 1 hour glucola today 17 lb 3.2 oz (7.802 kg)   - WET PREP FOR TRICH, YEAST, CLUE - Chlamydia/GC NAA, Confirmation  3. History of pre-eclampsia 110/70 Taking ASA 81 mg daily  4. History of cesarean delivery, currently pregnant 04/20/2008 followed by successful VBAC Will need [place] delivery plans apt   Preterm labor symptoms and general obstetric precautions including but not limited to vaginal bleeding, contractions, leaking of fluid and fetal movement were reviewed in detail with the patient. Please refer to After Visit Summary for other counseling recommendations.  Return in about 2 weeks (around 04/26/2022) for routine PNC.  No future appointments.  Dana Stephens, CNM

## 2022-04-12 NOTE — Progress Notes (Signed)
Counseled regarding recommendation for Tdap in pregnancy and for those who will spend any amt of time around the newborn. 28 week labs today. Jossie Ng, RN Tolerated Tdap without complaint. Jossie Ng, RN Hgb = 11.6. Wet prep reviewed and treated for yeast per standing order. Jossie Ng, RN

## 2022-04-14 ENCOUNTER — Telehealth: Payer: Self-pay

## 2022-04-14 ENCOUNTER — Telehealth: Payer: Self-pay | Admitting: Family Medicine

## 2022-04-14 LAB — CHLAMYDIA/GC NAA, CONFIRMATION
Chlamydia trachomatis, NAA: NEGATIVE
Neisseria gonorrhoeae, NAA: NEGATIVE

## 2022-04-14 LAB — HIV-1/HIV-2 QUALITATIVE RNA
HIV-1 RNA, Qualitative: NONREACTIVE
HIV-2 RNA, Qualitative: NONREACTIVE

## 2022-04-14 LAB — GLUCOSE, 1 HOUR GESTATIONAL: Gestational Diabetes Screen: 223 mg/dL — ABNORMAL HIGH (ref 70–139)

## 2022-04-14 LAB — RPR: RPR Ser Ql: NONREACTIVE

## 2022-04-14 NOTE — Telephone Encounter (Signed)
Returned patient call (see other phone note 04-14-22) Hart Carwin, RN

## 2022-04-14 NOTE — Telephone Encounter (Signed)
Patient returned the call and left a message with clerical to call her back (another phone note).    Return call back to patient regarding her abnormal 1 HR GTT=223.  Explained to her we needed to schedule a 3 HR GTT.  Appointment made for 04-20-2022 at 8:20 am (arrival time 8 am).  Patient instructed to be NPO after midnight on 04-19-22.  Briefly walked her through what the appointment consist of.  Language Line used today.  Larene Pickett / [phone].  Hart Carwin, RN

## 2022-04-14 NOTE — Telephone Encounter (Signed)
Telephone call to patient today regarding her abnormal 1 HR GTT=223 04-12-2022.  Patient needs a 3 HR GTT scheduled as soon as possible.  Left a message for patient to call [phone] regarding a repeat lab appointment.  Language Line used today.  Arlys John / [phone]. Hart Carwin, RN

## 2022-04-19 ENCOUNTER — Encounter: Payer: Self-pay | Admitting: Advanced Practice Midwife

## 2022-04-19 ENCOUNTER — Telehealth: Payer: Self-pay

## 2022-04-19 ENCOUNTER — Telehealth: Payer: Self-pay | Admitting: Advanced Practice Midwife

## 2022-04-19 ENCOUNTER — Other Ambulatory Visit: Payer: Self-pay | Admitting: Advanced Practice Midwife

## 2022-04-19 DIAGNOSIS — O24419 Gestational diabetes mellitus in pregnancy, unspecified control: Secondary | ICD-10-CM

## 2022-04-19 HISTORY — DX: Gestational diabetes mellitus in pregnancy, unspecified control: O24.419

## 2022-04-19 MED ORDER — LANCETS MISC: 1.0000 [IU] | Freq: Four times a day (QID) | 12 refills | Status: DC

## 2022-04-19 MED ORDER — BLOOD GLUCOSE TEST VI STRP: 1.0000 [IU] | ORAL_STRIP | Freq: Four times a day (QID) | 12 refills | Status: DC

## 2022-04-19 MED ORDER — BLOOD GLUCOSE MONITOR KIT: PACK | 0 refills | Status: DC

## 2022-04-19 NOTE — Progress Notes (Signed)
Client has [facility] appt for diabetic class scheduled. Jossie Ng, RN

## 2022-04-19 NOTE — Telephone Encounter (Signed)
E. Sciora CNM notified of 223 1 hour glucola result. Call to client and left message to call as soon as possible regarding appt tomorrow.   Due to 1 hour glucola result > /= to 220, 3 hour GTT is not to be done. Jossie Ng, RN

## 2022-04-19 NOTE — Telephone Encounter (Signed)
Return call from client and Marlene Yemen interpreted during call. Client counseled that 3 hour GTT not needed and appt cancelled. Counseled why needs [facility] appt and that will receive phone call from them with appt. Also counseled that prenatal care would be transferred to [facility] (per E. Sciora CNM). Jossie Ng, RN

## 2022-04-19 NOTE — Telephone Encounter (Signed)
T/C to pt via interpreter Marlene Yemen, to discuss new dx of gestational diabetes on 04/12/22 and potential implications to self and fetus with dx. Also discussed expectations of checking blood sugars 4x/day, exercise, weekly apts, maintaining blood sugar log daily, and [facility] as well as transfer to [facility] due to potential need for insulin daily. Also counseled to pick up rx sent to [facility] for glucometer, strips, lancets. Questions answered and referrals sent.

## 2022-04-19 NOTE — Telephone Encounter (Signed)
Client with 1 hour glucola 04/12/2022 and resulted at 223. 3 hour GTT was scheduled.

## 2022-04-20 ENCOUNTER — Other Ambulatory Visit: Payer: 59

## 2022-04-26 ENCOUNTER — Encounter: Payer: Self-pay | Admitting: *Deleted

## 2022-04-26 ENCOUNTER — Encounter: Payer: 59 | Attending: Advanced Practice Midwife | Admitting: *Deleted

## 2022-04-26 VITALS — BP 102/60 | Ht 60.0 in | Wt 152.0 lb

## 2022-04-26 DIAGNOSIS — Z713 Dietary counseling and surveillance: Secondary | ICD-10-CM | POA: Diagnosis not present

## 2022-04-26 DIAGNOSIS — O24419 Gestational diabetes mellitus in pregnancy, unspecified control: Secondary | ICD-10-CM | POA: Insufficient documentation

## 2022-04-26 DIAGNOSIS — Z3A28 28 weeks gestation of pregnancy: Secondary | ICD-10-CM | POA: Diagnosis not present

## 2022-04-26 DIAGNOSIS — O24410 Gestational diabetes mellitus in pregnancy, diet controlled: Secondary | ICD-10-CM

## 2022-04-26 NOTE — Patient Instructions (Signed)
Read booklet on Gestational Diabetes Follow Gestational Meal Planning Guidelines Avoid fruit juices and sugar sweetened drinks Don't skip meals - eat 1 protein and 1 carbohydrate serving Complete a 3 Day Food Record and bring to next appointment Check blood sugars 4 x day - before breakfast and 2 hrs after every meal and record  Bring blood sugar log to all appointments Purchase urine ketone strips if instructed by MD and check urine ketones every am:  If + increase bedtime snack to 1 protein and 2 carbohydrate servings Walk 20-30 minutes at least 5 x week if permitted by MD

## 2022-04-26 NOTE — Progress Notes (Signed)
Diabetes Self-Management Education  Visit Type: First/Initial  Appt. Start Time: 1500 Appt. End Time: 1630  04/26/2022  Dana Stephens, identified by name and date of birth, is a 31 y.o. female with a diagnosis of Diabetes: Gestational Diabetes.   ASSESSMENT  Blood pressure 102/60, height 5' (1.524 m), weight 152 lb (68.9 kg), last menstrual period 10/05/2021, estimated date of delivery 07/12/2022 Body mass index is 29.69 kg/m.   Diabetes Self-Management Education - 04/26/22 1702       Visit Information   Visit Type First/Initial      Initial Visit   Diabetes Type Gestational Diabetes    Date Diagnosed 1 week ago    Are you currently following a meal plan? No    Are you taking your medications as prescribed? Yes      Health Coping   How would you rate your overall health? Excellent      Psychosocial Assessment   Patient Belief/Attitude about Diabetes Other (comment)   "good"   What is the hardest part about your diabetes right now, causing you the most concern, or is the most worrisome to you about your diabetes?   Checking blood sugar    Self-care barriers English as a second language    Self-management support Doctor's office    Other persons present Interpreter    Patient Concerns Nutrition/Meal planning;Glycemic Control    Special Needs Other (comment)   Spanish materials   Preferred Learning Style Auditory    Learning Readiness Ready    How often do you need to have someone help you when you read instructions, pamphlets, or other written materials from your doctor or pharmacy? 1 - Never   if in Spanish   What is the last grade level you completed in school? 7      Pre-Education Assessment   Patient understands the diabetes disease and treatment process. Needs Instruction    Patient understands incorporating nutritional management into lifestyle. Needs Instruction    Patient undertands incorporating physical activity into lifestyle. Needs Instruction     Patient understands using medications safely. Needs Instruction    Patient understands monitoring blood glucose, interpreting and using results Needs Instruction    Patient understands prevention, detection, and treatment of acute complications. Needs Instruction    Patient understands prevention, detection, and treatment of chronic complications. Needs Instruction    Patient understands how to develop strategies to address psychosocial issues. Needs Instruction    Patient understands how to develop strategies to promote health/change behavior. Needs Instruction      Complications   How often do you check your blood sugar? 0 times/day (not testing)   She brought her meter (Accu-Chek Guide) and was instructed in use. BG upon return demonstration was 83 mg/dL at 0:93 pm - 4 hrs pp.   Have you had a dilated eye exam in the past 12 months? No    Have you had a dental exam in the past 12 months? No    Are you checking your feet? No      Dietary Intake   Breakfast skips    Snack (morning) fruit (banana, apple, cherries)    Lunch white rice, carrots, peas, onions, beans, egg with tomato, pork, chicken, soup with shrimp or chicken    Dinner same as lunch 2-3 pupusas with flour and cheese, broccoli, cauliflower, cuccumbers    Beverage(s) water, juice and occasional Gatorade      Activity / Exercise   Activity / Exercise Type Light (walking /  raking leaves)    How many days per week do you exercise? 3.5    How many minutes per day do you exercise? 20    Total minutes per week of exercise 70      Patient Education   Previous Diabetes Education No    Disease Pathophysiology Definition of diabetes, type 1 and 2, and the diagnosis of diabetes;Factors that contribute to the development of diabetes    Healthy Eating Role of diet in the treatment of diabetes and the relationship between the three main macronutrients and blood glucose level;Food label reading, portion sizes and measuring food.;Reviewed  blood glucose goals for pre and post meals and how to evaluate the patients' food intake on their blood glucose level.    Being Active Role of exercise on diabetes management, blood pressure control and cardiac health.    Medications Other (comment)   Limited use of oral medications during pregnancy and potential for insulin.   Monitoring Taught/evaluated SMBG meter.;Purpose and frequency of SMBG.;Taught/discussed recording of test results and interpretation of SMBG.;Ketone testing, when, how.    Chronic complications Relationship between chronic complications and blood glucose control    Diabetes Stress and Support Identified and addressed patients feelings and concerns about diabetes    Preconception care Pregnancy and GDM  Role of pre-pregnancy blood glucose control on the development of the fetus;Reviewed with patient blood glucose goals with pregnancy;Role of family planning for patients with diabetes      Individualized Goals (developed by patient)   Reducing Risk Other (comment)   improve blood sugars     Outcomes   Expected Outcomes Demonstrated interest in learning. Expect positive outcomes         Individualized Plan for Diabetes Self-Management Training:   Learning Objective:  Patient will have a greater understanding of diabetes self-management. Patient education plan is to attend individual and/or group sessions per assessed needs and concerns.   Plan:   Patient Instructions  Read booklet on Gestational Diabetes Follow Gestational Meal Planning Guidelines Avoid fruit juices and sugar sweetened drinks Don't skip meals - eat 1 protein and 1 carbohydrate serving Complete a 3 Day Food Record and bring to next appointment Check blood sugars 4 x day - before breakfast and 2 hrs after every meal and record  Bring blood sugar log to all appointments Purchase urine ketone strips if instructed by MD and check urine ketones every am:  If + increase bedtime snack to 1 protein and 2  carbohydrate servings Walk 20-30 minutes at least 5 x week if permitted by MD  Expected Outcomes:  Demonstrated interest in learning. Expect positive outcomes  Education material provided:  Gestational Booklet (Spanish) Gestational Meal Planning Guidelines (Spanish) Simple Meal Plan (Spanish) 3 Day Food Record (Spanish) Goals for a Healthy Pregnancy (Spanish)   If problems or questions, patient to contact team via:   Sharion Settler, RN, CCM, CDCES [phone]  Future DSME appointment:  Will call patient this week with next appointment to see the dietitian

## 2022-04-27 ENCOUNTER — Ambulatory Visit: Payer: 59

## 2022-05-18 ENCOUNTER — Encounter: Payer: Self-pay | Admitting: Dietician

## 2022-05-18 NOTE — Progress Notes (Signed)
Have not heard back from patient to reschedule her missed appointment from 05/13/22. Sent notification to referring provider.

## 2022-09-02 ENCOUNTER — Telehealth: Payer: Self-pay

## 2022-09-02 ENCOUNTER — Ambulatory Visit: Payer: BLUE CROSS/BLUE SHIELD

## 2022-09-02 NOTE — Telephone Encounter (Signed)
Call to client with Dana Stephens as [facility] for post-partum appt this am. Per client, she forgot about appt. Per client, needs am appt and appt rescheduled for 09/21/2022. Per client, received birth control in hospital after birth of baby. Jossie Ng, RN

## 2022-09-21 ENCOUNTER — Ambulatory Visit: Payer: BLUE CROSS/BLUE SHIELD

## 2022-10-10 ENCOUNTER — Ambulatory Visit: Payer: BLUE CROSS/BLUE SHIELD

## 2022-10-25 ENCOUNTER — Telehealth: Payer: Self-pay

## 2022-10-25 DIAGNOSIS — Z8632 Personal history of gestational diabetes: Secondary | ICD-10-CM | POA: Insufficient documentation

## 2022-10-25 NOTE — Telephone Encounter (Signed)
Patient return called and informed her of instructions for 2 hr gtt. Patient verbalized understanding.   Al Decant, RN

## 2022-10-25 NOTE — Telephone Encounter (Signed)
Call to patient - no answer x2 LVM.   Left message to see if patient can come in at 0900 for PE appointment tomorrow as she needs a 2 hr gtt. Informed in the VM that she would need to be fasting after midnight tonight.   Al Decant, RN

## 2022-10-26 ENCOUNTER — Ambulatory Visit (LOCAL_COMMUNITY_HEALTH_CENTER): Payer: BLUE CROSS/BLUE SHIELD | Admitting: Family Medicine

## 2022-10-26 DIAGNOSIS — Z8632 Personal history of gestational diabetes: Secondary | ICD-10-CM

## 2022-10-26 DIAGNOSIS — Z3009 Encounter for other general counseling and advice on contraception: Secondary | ICD-10-CM

## 2022-10-26 DIAGNOSIS — Z309 Encounter for contraceptive management, unspecified: Secondary | ICD-10-CM | POA: Diagnosis not present

## 2022-10-26 DIAGNOSIS — Z113 Encounter for screening for infections with a predominantly sexual mode of transmission: Secondary | ICD-10-CM

## 2022-10-26 LAB — HM HIV SCREENING LAB: HM HIV Screening: NEGATIVE

## 2022-10-26 LAB — WET PREP FOR TRICH, YEAST, CLUE
Trichomonas Exam: NEGATIVE
Yeast Exam: NEGATIVE

## 2022-10-26 LAB — HEMOGLOBIN, FINGERSTICK: Hemoglobin: 13 g/dL (ref 11.1–15.9)

## 2022-10-26 NOTE — Progress Notes (Signed)
[facility] Department  Postpartum Exam  Dana Stephens is a 32 y.o. [phone] female who presents for a postpartum visit. She is  17  weeks 2d postpartum following a normal spontaneous vaginal delivery.  I have fully reviewed the prenatal and intrapartum course. The delivery was at [redacted]w[redacted]d gestational weeks.  Anesthesia: epidural. Postpartum course has been good. Baby is doing well- weight at delivery- 6#3oz, at last peds appointment- 13# . Baby is feeding by bottle - Similac total comfort . Bleeding no bleeding. Bowel function is normal. Bladder function is normal. Patient is sexually active. Contraception method is Nexplanon. Postpartum depression screening: negative.   The pregnancy intention screening data noted above was reviewed. Potential methods of contraception were discussed. The patient elected to proceed with No data recorded.    Health Maintenance Due  Topic Date Due   INFLUENZA VACCINE  05/03/2022   COVID-19 Vaccine (2 - 2023-24 season) 06/03/2022    The following portions of the patient's history were reviewed and updated as appropriate: allergies, current medications, past family history, past medical history, past social history, past surgical history, and problem list.  Review of Systems Constitutional: negative Respiratory: negative Cardiovascular: negative Genitourinary:negative Integument/breast: negative Neurological: negative Behavioral/Psych: negative  Objective:  Ht 5' (1.524 m)   Breastfeeding No   BMI 29.69 kg/m    General:  alert and cooperative   Breasts:  normal  Lungs: clear to auscultation bilaterally  Heart:  regular rate and rhythm, S1, S2 normal, no murmur, click, rub or gallop  Abdomen: soft, non-tender; bowel sounds normal; no masses,  no organomegaly   Wound well approximated incision  GU exam:  normal       Assessment:   1. Postpartum exam -previous pap test 09/16/20, next due 09/16/25 -CBE today, normal, next due  10/26/25  2. History of gestational diabetes  - POCT Glucose (2 Hr PP) - Glucose tolerance, 2 hours  3. Family planning -had nexplanon placed in the hospital before discharge.    Plan:   Essential components of care per ACOG recommendations:  1.  Mood and well being: Patient with negative depression screening today. Reviewed local resources for support.  - Patient tobacco use? No.   - hx of drug use? No.    2. Infant care and feeding:  -Patient currently breastmilk feeding? No.  -Social determinants of health (SDOH) reviewed in EPIC. No concerns. States she has enough food at home, not concerned about transportation or paying for bills and necessitites  3. Sexuality, contraception and birth spacing - Patient does not want a pregnancy in the next year.  Desired family size is 3 children.  - Reviewed reproductive life planning. Reviewed options based on patient desire and reproductive life plan. Patient is interested in Hormonal Implant. This was not provided to the patient today. Nexplanon was placed in the hospital prior to discharge.  Risks, benefits, and typical effectiveness rates were reviewed.  Questions were answered.  Written information was also given to the patient to review.    The patient will follow up in  1 years for surveillance.  The patient was told to call with any further questions, or with any concerns about this method of contraception.  Emphasized use of condoms 100% of the time for STI prevention.  Patient was not offered ECP. Nexplanon placed in the hospital   - Discussed birth spacing of 18 months  4. Sleep and fatigue -Encouraged family/partner/community support of 4 hrs of uninterrupted sleep to help with mood  and fatigue  5. Physical Recovery  - Discussed patients delivery and complications. She describes her labor as good. - Patient had a Vaginal problems after delivery including cord avulsion and manual removal of placenta . Patient had a 2nd degree  laceration. Perineal healing reviewed. Patient expressed understanding - Patient has urinary incontinence? No. - Patient is safe to resume physical and sexual activity  6.  Health Maintenance - HM due items addressed Yes - Last pap smear 09/16/20 Pap smear not done at today's visit.  -Breast Cancer screening indicated? No.   7. Chronic Disease/Pregnancy Condition follow up: Gestational Diabetes   Sharlet Salina, [place] Department

## 2022-10-26 NOTE — Progress Notes (Signed)
Pt appointment for a late postpartum visit. 2 hour glucose test done d/t gestational diabetes. Seen by FNP Lowella Petties. Family planning packet given and contents reviewed. Initial lab results negative and reviewed with pt. Hgb at 13.0g/d.

## 2022-10-26 NOTE — Addendum Note (Signed)
Addended by: Sharlet Salina on: 10/26/2022 11:14 AM   Modules accepted: Orders

## 2022-10-27 LAB — GLUCOSE TOLERANCE, 2 HOURS
Glucose, 2 hour: 118 mg/dL (ref 70–139)
Glucose, GTT - Fasting: 86 mg/dL (ref 70–99)

## 2022-12-09 NOTE — Addendum Note (Signed)
Addended by: Cletis Media on: 12/09/2022 02:58 PM   Modules accepted: Orders

## 2024-04-27 ENCOUNTER — Emergency Department

## 2024-04-27 ENCOUNTER — Other Ambulatory Visit: Payer: Self-pay

## 2024-04-27 ENCOUNTER — Emergency Department
Admission: EM | Admit: 2024-04-27 | Discharge: 2024-04-27 | Disposition: A | Discharge status: Home / Self Care | Attending: Emergency Medicine | Admitting: Emergency Medicine

## 2024-04-27 DIAGNOSIS — Y92410 Unspecified street and highway as the place of occurrence of the external cause: Secondary | ICD-10-CM | POA: Diagnosis not present

## 2024-04-27 DIAGNOSIS — M25511 Pain in right shoulder: Secondary | ICD-10-CM | POA: Insufficient documentation

## 2024-04-27 DIAGNOSIS — M549 Dorsalgia, unspecified: Secondary | ICD-10-CM | POA: Insufficient documentation

## 2024-04-27 DIAGNOSIS — S0990XA Unspecified injury of head, initial encounter: Secondary | ICD-10-CM | POA: Diagnosis present

## 2024-04-27 DIAGNOSIS — M25512 Pain in left shoulder: Secondary | ICD-10-CM | POA: Diagnosis not present

## 2024-04-27 DIAGNOSIS — M542 Cervicalgia: Secondary | ICD-10-CM | POA: Diagnosis not present

## 2024-04-27 MED ORDER — KETOROLAC TROMETHAMINE 15 MG/ML IJ SOLN
15.0000 mg | Freq: Once | INTRAMUSCULAR | Status: AC
  Administered 2024-04-27: 15 mg via INTRAMUSCULAR
  Filled 2024-04-27: qty 1

## 2024-04-27 MED ORDER — LIDOCAINE 5 % EX PTCH
1.0000 | MEDICATED_PATCH | CUTANEOUS | Status: DC
  Administered 2024-04-27: 1 via TRANSDERMAL
  Filled 2024-04-27: qty 1

## 2024-04-27 MED ORDER — ACETAMINOPHEN 325 MG PO TABS
650.0000 mg | ORAL_TABLET | Freq: Once | ORAL | Status: AC
  Administered 2024-04-27: 650 mg via ORAL
  Filled 2024-04-27: qty 2

## 2024-04-27 NOTE — Discharge Instructions (Signed)
 Your CT scans were normal.  You may continue to take Tylenol /ibuprofen  per package instructions to help with your symptoms.  Please return for any new, worsening, or change in symptoms or other concerns.  It was a pleasure caring for you today.
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
